# Patient Record
Sex: Female | Born: 1975 | Race: White | Hispanic: No | Smoking: Never smoker
Health system: Southern US, Community
[De-identification: ages and names within clinical notes are randomized; demographics above are authoritative.]

## PROBLEM LIST (undated history)

## (undated) DIAGNOSIS — F419 Anxiety disorder, unspecified: Secondary | ICD-10-CM

## (undated) DIAGNOSIS — F329 Major depressive disorder, single episode, unspecified: Secondary | ICD-10-CM

## (undated) DIAGNOSIS — D649 Anemia, unspecified: Secondary | ICD-10-CM

## (undated) HISTORY — DX: Anemia, unspecified: D64.9

## (undated) HISTORY — DX: Anxiety disorder, unspecified: F41.9

## (undated) HISTORY — DX: Major depressive disorder, single episode, unspecified: F32.9

## (undated) HISTORY — PX: GASTRIC BYPASS: SHX52

---

## 2003-07-21 DIAGNOSIS — F341 Dysthymic disorder: Secondary | ICD-10-CM | POA: Insufficient documentation

## 2003-11-20 HISTORY — PX: CHOLECYSTECTOMY: SHX55

## 2003-11-20 HISTORY — PX: APPENDECTOMY: SHX54

## 2004-01-25 ENCOUNTER — Other Ambulatory Visit: Payer: Self-pay

## 2004-09-27 ENCOUNTER — Inpatient Hospital Stay: Payer: Self-pay | Admitting: General Surgery

## 2004-11-23 DIAGNOSIS — A6000 Herpesviral infection of urogenital system, unspecified: Secondary | ICD-10-CM | POA: Insufficient documentation

## 2004-12-06 LAB — HM HEPATITIS C SCREENING LAB: HM Hepatitis Screen: NEGATIVE

## 2005-02-18 ENCOUNTER — Emergency Department: Payer: Self-pay | Admitting: Emergency Medicine

## 2014-07-20 LAB — HM PAP SMEAR: HM PAP: POSITIVE

## 2014-10-05 LAB — CBC AND DIFFERENTIAL
HCT: 29 % — AB (ref 36–46)
Hemoglobin: 8.2 g/dL — AB (ref 12.0–16.0)
PLATELETS: 376 10*3/uL (ref 150–399)
WBC: 3.8 10*3/mL

## 2014-10-05 LAB — TSH: TSH: 3.96 u[IU]/mL (ref 0.41–5.90)

## 2015-07-13 HISTORY — PX: KNEE ARTHROSCOPY W/ ACL RECONSTRUCTION: SHX1858

## 2015-08-31 LAB — CBC AND DIFFERENTIAL
HCT: 36 % (ref 36–46)
HEMOGLOBIN: 12.2 g/dL (ref 12.0–16.0)
Neutrophils Absolute: 58 /uL
Platelets: 272 10*3/uL (ref 150–399)
WBC: 4.2 10*3/mL

## 2015-08-31 LAB — HEPATIC FUNCTION PANEL
ALK PHOS: 82 U/L (ref 25–125)
ALT: 14 U/L (ref 7–35)
AST: 18 U/L (ref 13–35)
BILIRUBIN, TOTAL: 0.5 mg/dL

## 2015-08-31 LAB — BASIC METABOLIC PANEL
BUN: 9 mg/dL (ref 4–21)
CREATININE: 1 mg/dL (ref 0.5–1.1)
Glucose: 86 mg/dL
POTASSIUM: 4.5 mmol/L (ref 3.4–5.3)
Sodium: 139 mmol/L (ref 137–147)

## 2015-08-31 LAB — LIPID PANEL
Cholesterol: 138 mg/dL (ref 0–200)
HDL: 69 mg/dL (ref 35–70)
LDL Cholesterol: 57 mg/dL
LDL/HDL RATIO: 0.8
TRIGLYCERIDES: 58 mg/dL (ref 40–160)

## 2015-08-31 LAB — TSH: TSH: 4 u[IU]/mL (ref 0.41–5.90)

## 2015-10-26 ENCOUNTER — Other Ambulatory Visit: Payer: Self-pay | Admitting: Family Medicine

## 2015-10-26 DIAGNOSIS — D509 Iron deficiency anemia, unspecified: Secondary | ICD-10-CM | POA: Insufficient documentation

## 2015-10-31 ENCOUNTER — Encounter: Payer: Self-pay | Admitting: Family Medicine

## 2015-10-31 ENCOUNTER — Ambulatory Visit (INDEPENDENT_AMBULATORY_CARE_PROVIDER_SITE_OTHER): Payer: BLUE CROSS/BLUE SHIELD | Admitting: Family Medicine

## 2015-10-31 VITALS — BP 122/86 | HR 85 | Temp 98.1°F | Resp 16 | Ht 63.0 in | Wt 226.0 lb

## 2015-10-31 DIAGNOSIS — B977 Papillomavirus as the cause of diseases classified elsewhere: Secondary | ICD-10-CM | POA: Diagnosis not present

## 2015-10-31 DIAGNOSIS — R7989 Other specified abnormal findings of blood chemistry: Secondary | ICD-10-CM | POA: Insufficient documentation

## 2015-10-31 DIAGNOSIS — IMO0002 Reserved for concepts with insufficient information to code with codable children: Secondary | ICD-10-CM | POA: Insufficient documentation

## 2015-10-31 DIAGNOSIS — F325 Major depressive disorder, single episode, in full remission: Secondary | ICD-10-CM

## 2015-10-31 DIAGNOSIS — F411 Generalized anxiety disorder: Secondary | ICD-10-CM | POA: Insufficient documentation

## 2015-10-31 DIAGNOSIS — R8789 Other abnormal findings in specimens from female genital organs: Secondary | ICD-10-CM | POA: Diagnosis not present

## 2015-10-31 DIAGNOSIS — G4700 Insomnia, unspecified: Secondary | ICD-10-CM | POA: Diagnosis not present

## 2015-10-31 DIAGNOSIS — Z3009 Encounter for other general counseling and advice on contraception: Secondary | ICD-10-CM | POA: Insufficient documentation

## 2015-10-31 DIAGNOSIS — E669 Obesity, unspecified: Secondary | ICD-10-CM | POA: Insufficient documentation

## 2015-10-31 DIAGNOSIS — E559 Vitamin D deficiency, unspecified: Secondary | ICD-10-CM | POA: Insufficient documentation

## 2015-10-31 MED ORDER — TRAZODONE HCL 50 MG PO TABS: 50.0000 mg | ORAL_TABLET | Freq: Every day | ORAL | Status: DC

## 2015-10-31 MED ORDER — ALPRAZOLAM 0.5 MG PO TABS: 0.5000 mg | ORAL_TABLET | Freq: Two times a day (BID) | ORAL | Status: DC | PRN

## 2015-10-31 NOTE — Progress Notes (Signed)
Patient ID: Carrie Stokes, female   DOB: [birth year], 39 y.o.   MRN: [id number]        Patient: Carrie Stokes Female    DOB: 1976/07/15   39 y.o.   MRN: [id number] Visit Date: 10/31/2015  Today's Provider: Margarita Rana, MD   Chief Complaint  Patient presents with  . Anemia  . Abnormal Pap Smear  . Anxiety   Subjective:    HPI Follow-up Anemia: Patient presents for follow-up of anemia. Anemia was found by after [place] By-pass.  It has been present for 1 year.  Associated signs & symptoms: none. Patient had labs done on 08/31/15 at [facility].  No abnormalities noted.  Reviewed at [facility] today.   Lab Results  Component Value Date   WBC 4.2 08/31/2015   HGB 12.2 08/31/2015   HCT 36 08/31/2015   PLT 272 08/31/2015   CHOL 138 08/31/2015   TRIG 58 08/31/2015   HDL 69 08/31/2015   LDLCALC 57 08/31/2015   ALT 14 08/31/2015   AST 18 08/31/2015   NA 139 08/31/2015   K 4.5 08/31/2015   CREATININE 1.0 08/31/2015   BUN 9 08/31/2015   TSH 4.00 08/31/2015     Anxiety: Patient complains of anxiety disorder and sleep disturbance.  She has the following symptoms: dizziness, fatigue, insomnia, irritable, palpitations. Onset of symptoms was approximately 1 year ago, gradually worsening since that time. She denies current suicidal and homicidal ideation. Family history significant for no psychiatric illness.Possible organic causes contributing are: family problems (husband). Risk factors: none Previous treatment includes Xanax and medication.  She complains of the following side effects from the treatment: none. Patient reports that she has been off alprazolam since April of this year. Patient is requesting refill. Is also off her Zoloft and Trazodone.  Husband still drinking.  Can not get him to quit. Has even talked to a Chief Executive Officer. She also has not been able to work out because of knee pain. Needed surgery.  Had the surgery. Just got released.  Planning to go back to the gym.      Follow up for abnormal pap  The patient was last seen for this 1 years ago on 07/21/2015. Pap was normal, HPV positive. Patient advised to recheck in 1 year.   --------------------------------------------------------------------------------------------------------------------------       Allergies  Allergen Reactions  . Cetirizine Hives  . Pseudoephedrine Hives  . Terazol  [Terconazole] Rash and Swelling   Previous Medications   FERROUS GLUCONATE (FERGON) 240 (27 FE) MG TABLET    TAKE 1 TABLET BY MOUTH 2 TIMES A DAY   LEVONORGESTREL (MIRENA, 52 MG,) 20 MCG/24HR IUD    by Intrauterine route.   MULTIPLE VITAMIN (MULTIVITAMINS PO)    Take by mouth.    Review of Systems  Social History  Substance Use Topics  . Smoking status: Never Smoker   . Smokeless tobacco: Never Used  . Alcohol Use: Yes     Comment: occasional   Objective:   BP 122/86 mmHg  Pulse 85  Temp(Src) 98.1 F (36.7 C) (Oral)  Resp 16  Ht 5\' 3"  (1.6 m)  Wt 226 lb (102.513 kg)  BMI 40.04 kg/m2  SpO2 98%  Physical Exam  Constitutional: She is oriented to person, place, and time. She appears well-developed and well-nourished.  Cardiovascular: Normal rate and regular rhythm.   Pulmonary/Chest: Effort normal and breath sounds normal.  Genitourinary: Vagina normal.  Pap done today.   Neurological: She is alert and oriented to  person, place, and time.  Psychiatric: She has a normal mood and affect. Her behavior is normal. Judgment and thought content normal.      Assessment & Plan:     1. Abnormal cervical Pap smear with positive HPV DNA test Done today.  - Pap IG and HPV (high risk) DNA detection  2. Anxiety, generalized Will restart medication to use as needed.  - ALPRAZolam (XANAX) 0.5 MG tablet; Take 1 tablet (0.5 mg total) by mouth 2 (two) times daily as needed for anxiety.  Dispense: 60 tablet; Refill: 0  3. Depression, major, in remission ([place]) Does not currently feel depressed, more  anxious. Will restart Trazodone and Xanax, consider restarting SSRI if this does not help.  4. Cannot sleep Condition is worsening. Will start medication for better control.  Patient instructed to call back if condition worsens or does not improve.    - traZODone (DESYREL) 50 MG tablet; Take 1 tablet (50 mg total) by mouth at bedtime.  Dispense: 30 tablet; Refill: 5      Patient was seen and examined by Jerrell Belfast, MD, and note scribed by Lynford Humphrey, [place].   I have reviewed the document for accuracy and completeness and I agree with above. - Jerrell Belfast, MD   Margarita Rana, MD  [facility]

## 2015-11-03 LAB — PAP IG AND HPV HIGH-RISK
HPV, HIGH-RISK: POSITIVE — AB
PAP Smear Comment: 0

## 2015-11-07 ENCOUNTER — Telehealth: Payer: Self-pay

## 2015-11-07 DIAGNOSIS — R87629 Unspecified abnormal cytological findings in specimens from vagina: Secondary | ICD-10-CM

## 2015-11-07 NOTE — Telephone Encounter (Signed)
Pt advised. She would like to be referred to [facility].   Thanks,   -Mickel Baas

## 2015-11-07 NOTE — Telephone Encounter (Signed)
LMTCB 11/07/2015  Thanks,   -Juanice Warburton 

## 2015-11-07 NOTE — Telephone Encounter (Signed)
-----   Message from Margarita Rana, MD sent at 11/03/2015  5:09 PM EST ----- HPV still positive with some atypical cells.  No cancer cells. Referral to gyn to evaluate and treat. Thanks.

## 2015-11-18 ENCOUNTER — Ambulatory Visit (INDEPENDENT_AMBULATORY_CARE_PROVIDER_SITE_OTHER): Payer: BLUE CROSS/BLUE SHIELD | Admitting: Family Medicine

## 2015-11-18 ENCOUNTER — Encounter: Payer: Self-pay | Admitting: Family Medicine

## 2015-11-18 VITALS — BP 128/70 | HR 98 | Temp 98.2°F | Resp 16 | Ht 63.0 in | Wt 230.0 lb

## 2015-11-18 DIAGNOSIS — L509 Urticaria, unspecified: Secondary | ICD-10-CM | POA: Diagnosis not present

## 2015-11-18 MED ORDER — PREDNISONE 10 MG PO TABS: ORAL_TABLET | ORAL | Status: DC

## 2015-11-18 NOTE — Progress Notes (Signed)
Patient ID: Carrie Stokes, female   DOB: 04-09-1976, 39 y.o.   MRN: [id number]       Patient: Carrie Stokes Female    DOB: 07-18-1976   39 y.o.   MRN: [id number] Visit Date: 11/18/2015  Today's Provider: Margarita Rana, MD   Chief Complaint  Patient presents with  . Urticaria    worsened since yesterday.    Subjective:    Urticaria This is a new problem. The current episode started yesterday. The problem has been gradually worsening since onset. The affected locations include the face, head, scalp, right ear, left ear, neck and chest. The rash is characterized by redness, swelling, burning and itchiness. She was exposed to nothing. Associated symptoms include facial edema. Pertinent negatives include no congestion, cough, eye pain, fever, joint pain, shortness of breath or sore throat. (Patient reports that she feels like her tongue is starting to swell. ) Past treatments include antihistamine. The treatment provided moderate relief.       Allergies  Allergen Reactions  . Cetirizine Hives  . Pseudoephedrine Hives  . Terazol  [Terconazole] Rash and Swelling   Previous Medications   ALPRAZOLAM (XANAX) 0.5 MG TABLET    Take 1 tablet (0.5 mg total) by mouth 2 (two) times daily as needed for anxiety.   FERROUS GLUCONATE (FERGON) 240 (27 FE) MG TABLET    TAKE 1 TABLET BY MOUTH 2 TIMES A DAY   LEVONORGESTREL (MIRENA, 52 MG,) 20 MCG/24HR IUD    by Intrauterine route.   MULTIPLE VITAMIN (MULTIVITAMINS PO)    Take by mouth.   TRAZODONE (DESYREL) 50 MG TABLET    Take 1 tablet (50 mg total) by mouth at bedtime.    Review of Systems  Constitutional: Negative.  Negative for fever.  HENT: Negative.  Negative for congestion and sore throat.   Eyes: Negative for pain.  Respiratory: Negative.  Negative for cough and shortness of breath.   Cardiovascular: Negative.   Musculoskeletal: Negative for joint pain.  Skin: Positive for color change and rash. Negative for pallor and  wound.  Psychiatric/Behavioral: The patient is nervous/anxious.     Social History  Substance Use Topics  . Smoking status: Never Smoker   . Smokeless tobacco: Never Used  . Alcohol Use: Yes     Comment: occasional   Objective:   BP 128/70 mmHg  Pulse 98  Temp(Src) 98.2 F (36.8 C)  Resp 16  Ht 5\' 3"  (1.6 m)  Wt 230 lb (104.327 kg)  BMI 40.75 kg/m2  SpO2 99%  Physical Exam  Constitutional: She is oriented to person, place, and time. She appears well-developed and well-nourished.  Cardiovascular: Normal rate and regular rhythm.   Pulmonary/Chest: Effort normal and breath sounds normal.  Musculoskeletal: She exhibits no edema.  Neurological: She is alert and oriented to person, place, and time.  Skin: Rash noted. There is erythema.  Erythema behind ears, forehead, and both eyelids.   Psychiatric: She has a normal mood and affect. Her behavior is normal. Judgment and thought content normal.  Nursing note and vitals reviewed.     Assessment & Plan:     1. Urticarial dermatitis New problem. Worsening. Suspect related to stress.  Will treat.  Patient instructed to call back if condition worsens or does not improve and may need treatment for her anxiety.  - predniSONE (DELTASONE) 10 MG tablet; 6 po for 2 days and then 5 po for 2 days and then 4 po for 2 days and 3 po  for 2 days and then 2 po for 2 days and then 1 po for 2 days.  Dispense: 42 tablet; Refill: 0      Margarita Rana, MD  [facility]

## 2016-01-03 ENCOUNTER — Other Ambulatory Visit: Payer: Self-pay

## 2016-01-03 DIAGNOSIS — G4700 Insomnia, unspecified: Secondary | ICD-10-CM

## 2016-01-03 MED ORDER — TRAZODONE HCL 50 MG PO TABS: 50.0000 mg | ORAL_TABLET | Freq: Every day | ORAL | Status: DC

## 2016-01-03 NOTE — Addendum Note (Signed)
Addended by: Ashley Royalty E on: 01/03/2016 11:30 AM   Modules accepted: Orders

## 2016-02-16 ENCOUNTER — Encounter: Payer: Self-pay | Admitting: Family Medicine

## 2016-02-27 DIAGNOSIS — M531 Cervicobrachial syndrome: Secondary | ICD-10-CM | POA: Diagnosis not present

## 2016-02-27 DIAGNOSIS — M9902 Segmental and somatic dysfunction of thoracic region: Secondary | ICD-10-CM | POA: Diagnosis not present

## 2016-02-27 DIAGNOSIS — M5387 Other specified dorsopathies, lumbosacral region: Secondary | ICD-10-CM | POA: Diagnosis not present

## 2016-05-28 DIAGNOSIS — Z124 Encounter for screening for malignant neoplasm of cervix: Secondary | ICD-10-CM | POA: Diagnosis not present

## 2016-05-28 DIAGNOSIS — Z1151 Encounter for screening for human papillomavirus (HPV): Secondary | ICD-10-CM | POA: Diagnosis not present

## 2016-05-28 DIAGNOSIS — R87618 Other abnormal cytological findings on specimens from cervix uteri: Secondary | ICD-10-CM | POA: Diagnosis not present

## 2016-05-28 DIAGNOSIS — Z760 Encounter for issue of repeat prescription: Secondary | ICD-10-CM | POA: Diagnosis not present

## 2016-07-15 DIAGNOSIS — L03119 Cellulitis of unspecified part of limb: Secondary | ICD-10-CM | POA: Diagnosis not present

## 2016-07-17 ENCOUNTER — Ambulatory Visit (INDEPENDENT_AMBULATORY_CARE_PROVIDER_SITE_OTHER): Payer: BLUE CROSS/BLUE SHIELD | Admitting: Family Medicine

## 2016-07-17 ENCOUNTER — Encounter: Payer: Self-pay | Admitting: Family Medicine

## 2016-07-17 VITALS — BP 124/74 | HR 97 | Temp 98.5°F | Resp 16 | Wt 243.4 lb

## 2016-07-17 DIAGNOSIS — L02434 Carbuncle of left upper limb: Secondary | ICD-10-CM | POA: Diagnosis not present

## 2016-07-17 NOTE — Progress Notes (Signed)
Subjective:     Patient ID: Carrie Stokes, female   DOB: [birth year], 40 y.o.   MRN: [id number]  HPI  Chief Complaint  Patient presents with  . Cellulitis    Patient comes in office today for follow up visit after being seen at [facility] on 07/15/16. Patient states that on 07/14/16 she had returned back from a cruise to the [place], patient had noticed a small white bump on her left elbow. Patient states that she was prescribed clindamycin 300mg , she reports skin is hot and tight and she has more swelling around site and tingling in her arm.   No hx of or exposure to MRSA. Placed on clindamycin 300 mg. 4 x day for 10 days.   Review of Systems     Objective:   Physical Exam  Constitutional: She appears well-developed and well-nourished. No distress.  Skin:  Left proximal forearm with pointing carbuncle Procedure note: cleansed with alcohol and infiltrated with lidocaine/epinephrine. I & D with moderate return of pus and blood. C & S obtained       Assessment:    1. Carbuncle of arm, left - WOUND CULTURE - INCISION AND DRAINAGE; Future    Plan:    Discussed wound care with further f/u pending culture results.

## 2016-07-17 NOTE — Patient Instructions (Signed)
We will call you with the culture report. Continue clindamycin for now. Cleanse with soap and water and apply bandaid.

## 2016-07-20 ENCOUNTER — Telehealth: Payer: Self-pay

## 2016-07-20 ENCOUNTER — Other Ambulatory Visit: Payer: Self-pay | Admitting: Family Medicine

## 2016-07-20 DIAGNOSIS — N760 Acute vaginitis: Secondary | ICD-10-CM

## 2016-07-20 LAB — WOUND CULTURE

## 2016-07-20 MED ORDER — TERCONAZOLE 0.8 % VA CREA: 1.0000 | TOPICAL_CREAM | Freq: Every day | VAGINAL | 0 refills | Status: DC

## 2016-07-20 MED ORDER — FLUCONAZOLE 150 MG PO TABS: 150.0000 mg | ORAL_TABLET | Freq: Once | ORAL | 0 refills | Status: AC

## 2016-07-20 NOTE — Telephone Encounter (Signed)
Cream sent in

## 2016-07-20 NOTE — Telephone Encounter (Signed)
Spoke with patient on the phone and advised, she states that she never had a reaction to terconazole cream and that she has been using the last bit she had from an old prescription at home to give her some relief. Patient is asking that you send cream into pharmacy instead of pill. KW

## 2016-07-20 NOTE — Telephone Encounter (Signed)
-----   Message from Carmon Ginsberg, [place] sent at 07/20/2016  7:45 AM EDT ----- You have a staph infection (not MRSA) sensitive to the clindamycin you are on. Is it getting better?

## 2016-07-20 NOTE — Telephone Encounter (Signed)
Patient was advised she states that wound is still painful and hot to the touch, she reports brownish bloody like drainage from site but states that it has decreased in size. Patient reports that she is taking the Clindamycin, she complains of external vaginal itching and request that you send in vaginal cream to [facility] on [address]. Please advise. KW

## 2016-07-20 NOTE — Telephone Encounter (Signed)
I have sent in the Diflucan pill as she has had prior reaction to terconazole cream.

## 2016-08-02 ENCOUNTER — Other Ambulatory Visit: Payer: Self-pay | Admitting: Family Medicine

## 2016-08-02 DIAGNOSIS — G4700 Insomnia, unspecified: Secondary | ICD-10-CM

## 2016-12-07 ENCOUNTER — Encounter: Payer: Self-pay | Admitting: Physician Assistant

## 2016-12-07 ENCOUNTER — Ambulatory Visit (INDEPENDENT_AMBULATORY_CARE_PROVIDER_SITE_OTHER): Payer: BLUE CROSS/BLUE SHIELD | Admitting: Physician Assistant

## 2016-12-07 VITALS — BP 138/88 | HR 80 | Temp 98.3°F | Resp 16 | Ht 63.0 in | Wt 250.0 lb

## 2016-12-07 DIAGNOSIS — E6609 Other obesity due to excess calories: Secondary | ICD-10-CM | POA: Diagnosis not present

## 2016-12-07 DIAGNOSIS — IMO0001 Reserved for inherently not codable concepts without codable children: Secondary | ICD-10-CM

## 2016-12-07 DIAGNOSIS — F411 Generalized anxiety disorder: Secondary | ICD-10-CM | POA: Diagnosis not present

## 2016-12-07 DIAGNOSIS — Z6841 Body Mass Index (BMI) 40.0 and over, adult: Secondary | ICD-10-CM | POA: Diagnosis not present

## 2016-12-07 DIAGNOSIS — D234 Other benign neoplasm of skin of scalp and neck: Secondary | ICD-10-CM

## 2016-12-07 MED ORDER — PHENTERMINE HCL 37.5 MG PO TABS: 37.5000 mg | ORAL_TABLET | Freq: Every day | ORAL | 0 refills | Status: DC

## 2016-12-07 MED ORDER — ALPRAZOLAM 0.5 MG PO TABS: 0.5000 mg | ORAL_TABLET | Freq: Two times a day (BID) | ORAL | 5 refills | Status: DC | PRN

## 2016-12-07 NOTE — Progress Notes (Signed)
Patient: Carrie Stokes Female    DOB: [birth year]   41 y.o.   MRN: [id number] Visit Date: 12/07/2016  Today's Provider: Mar Daring, PA-C   Chief Complaint  Patient presents with  . Anxiety   Subjective:    HPI   Anxiety, Follow-up  She  was last seen for this 1 years ago. Changes made at last visit include no changes.   She reports excellent compliance with treatment. She is not having side effects.   She reports excellent tolerance of treatment. Current symptoms include: fatigue and weight gain She feels she is Worse since last visit.  ------------------------------------------------------------------------  Patient here today to talk about weight loss options. Patient reports she has tried phentermine 37.5 mg in the past with good results.   Patient c/o some "spots" on her head. Patient reports spots have been there since she was 41 years old. Patient reports that one of the spots is tender to the touch.    Allergies  Allergen Reactions  . Cetirizine Hives  . Pseudoephedrine Hives     Current Outpatient Prescriptions:  .  ALPRAZolam (XANAX) 0.5 MG tablet, Take 1 tablet (0.5 mg total) by mouth 2 (two) times daily as needed for anxiety., Disp: 60 tablet, Rfl: 0 .  ferrous gluconate (FERGON) 240 (27 FE) MG tablet, TAKE 1 TABLET BY MOUTH 2 TIMES A DAY, Disp: 100 tablet, Rfl: 5 .  levonorgestrel (MIRENA, 52 MG,) 20 MCG/24HR IUD, by Intrauterine route., Disp: , Rfl:  .  Multiple Vitamin (MULTIVITAMINS PO), Take by mouth., Disp: , Rfl:  .  traZODone (DESYREL) 50 MG tablet, TAKE 1 TABLET (50 MG TOTAL) BY MOUTH AT BEDTIME., Disp: 90 tablet, Rfl: 1  Review of Systems  Constitutional: Positive for fatigue and unexpected weight change.  Respiratory: Negative.   Cardiovascular: Negative.   Gastrointestinal: Negative.   Endocrine: Negative.   Skin:       Tender nodules on scalp  Neurological: Negative.   Psychiatric/Behavioral: Negative.      Social History  Substance Use Topics  . Smoking status: Never Smoker  . Smokeless tobacco: Never Used  . Alcohol use Yes     Comment: occasional   Objective:   BP 138/88 (BP Location: Left Arm, Patient Position: Sitting, Cuff Size: Large)   Pulse 80   Temp 98.3 F (36.8 C) (Oral)   Resp 16   Ht 5\' 3"  (1.6 m)   Wt 250 lb (113.4 kg)   BMI 44.29 kg/m   Physical Exam  Constitutional: She appears well-developed and well-nourished. No distress.  Neck: Normal range of motion. Neck supple. No tracheal deviation present. No thyromegaly present.  Cardiovascular: Normal rate, regular rhythm and normal heart sounds.  Exam reveals no gallop and no friction rub.   No murmur heard. Pulmonary/Chest: Effort normal and breath sounds normal. No respiratory distress. She has no wheezes. She has no rales.  Lymphadenopathy:    She has no cervical adenopathy.  Skin: Rash noted. Rash is nodular. She is not diaphoretic.     Psychiatric: She has a normal mood and affect. Her behavior is normal. Judgment and thought content normal.  Vitals reviewed.     Assessment & Plan:     1. Anxiety, generalized Stable. Diagnosis pulled for medication refill. Continue current medical treatment plan. - ALPRAZolam (XANAX) 0.5 MG tablet; Take 1 tablet (0.5 mg total) by mouth 2 (two) times daily as needed for anxiety.  Dispense: 60 tablet; Refill: 5  2.  Dermoid cyst of scalp Patient already has a dermatologist in [place]. She will call to schedule.  3. Class 3 obesity due to excess calories with serious comorbidity and body mass index (BMI) of 40.0 to 44.9 in adult [facility]) Patient has been exercising and using a food diary but not limiting completely. Advised patient to be more strict with her food diary and make exercise more scheduled. Will add phentermine as below for appetite suppression. I will see her back in 4 weeks for weight recheck. - phentermine (ADIPEX-P) 37.5 MG tablet; Take 1 tablet (37.5 mg total)  by mouth daily before breakfast.  Dispense: 30 tablet; Refill: 0       Mar Daring, PA-C  [facility]

## 2016-12-07 NOTE — Patient Instructions (Signed)
Epidermal Cyst An epidermal cyst is sometimes called a sebaceous cyst, epidermal inclusion cyst, or infundibular cyst. These cysts usually contain a substance that looks "pasty" or "cheesy" and may have a bad smell. This substance is a protein called keratin. Epidermal cysts are usually found on the face, neck, or trunk. They may also occur in the vaginal area or other parts of the genitalia of both men and women. Epidermal cysts are usually small, painless, slow-growing bumps or lumps that move freely under the skin. It is important not to try to pop them. This may cause an infection and lead to tenderness and swelling. CAUSES  Epidermal cysts may be caused by a deep penetrating injury to the skin or a plugged hair follicle, often associated with acne. SYMPTOMS  Epidermal cysts can become inflamed and cause:  Redness.  Tenderness.  Increased temperature of the skin over the bumps or lumps.  Grayish-white, bad smelling material that drains from the bump or lump. DIAGNOSIS  Epidermal cysts are easily diagnosed by your caregiver during an exam. Rarely, a tissue sample (biopsy) may be taken to rule out other conditions that may resemble epidermal cysts. TREATMENT   Epidermal cysts often get better and disappear on their own. They are rarely ever cancerous.  If a cyst becomes infected, it may become inflamed and tender. This may require opening and draining the cyst. Treatment with antibiotics may be necessary. When the infection is gone, the cyst may be removed with minor surgery.  Small, inflamed cysts can often be treated with antibiotics or by injecting steroid medicines.  Sometimes, epidermal cysts become large and bothersome. If this happens, surgical removal in your caregiver's office may be necessary. HOME CARE INSTRUCTIONS  Only take over-the-counter or prescription medicines as directed by your caregiver.  Take your antibiotics as directed. Finish them even if you start to feel  better. SEEK MEDICAL CARE IF:   Your cyst becomes tender, red, or swollen.  Your condition is not improving or is getting worse.  You have any other questions or concerns. MAKE SURE YOU:  Understand these instructions.  Will watch your condition.  Will get help right away if you are not doing well or get worse. This information is not intended to replace advice given to you by your health care provider. Make sure you discuss any questions you have with your health care provider. Document Released: 10/06/2004 Document Revised: 01/28/2012 Document Reviewed: 09/07/2015 Elsevier Interactive Patient Education  2017 [employer].

## 2016-12-10 DIAGNOSIS — L7211 Pilar cyst: Secondary | ICD-10-CM | POA: Diagnosis not present

## 2016-12-19 DIAGNOSIS — L7211 Pilar cyst: Secondary | ICD-10-CM | POA: Diagnosis not present

## 2017-01-03 DIAGNOSIS — L720 Epidermal cyst: Secondary | ICD-10-CM | POA: Diagnosis not present

## 2017-01-03 DIAGNOSIS — L7211 Pilar cyst: Secondary | ICD-10-CM | POA: Diagnosis not present

## 2017-01-04 ENCOUNTER — Ambulatory Visit: Payer: BLUE CROSS/BLUE SHIELD | Admitting: Physician Assistant

## 2017-01-09 ENCOUNTER — Ambulatory Visit (INDEPENDENT_AMBULATORY_CARE_PROVIDER_SITE_OTHER): Payer: BLUE CROSS/BLUE SHIELD | Admitting: Physician Assistant

## 2017-01-09 ENCOUNTER — Encounter: Payer: Self-pay | Admitting: Physician Assistant

## 2017-01-09 DIAGNOSIS — Z6841 Body Mass Index (BMI) 40.0 and over, adult: Secondary | ICD-10-CM

## 2017-01-09 DIAGNOSIS — E6609 Other obesity due to excess calories: Secondary | ICD-10-CM | POA: Diagnosis not present

## 2017-01-09 DIAGNOSIS — IMO0001 Reserved for inherently not codable concepts without codable children: Secondary | ICD-10-CM

## 2017-01-09 DIAGNOSIS — F5101 Primary insomnia: Secondary | ICD-10-CM | POA: Diagnosis not present

## 2017-01-09 MED ORDER — TRAZODONE HCL 50 MG PO TABS: 50.0000 mg | ORAL_TABLET | Freq: Every day | ORAL | 1 refills | Status: DC

## 2017-01-09 MED ORDER — PHENTERMINE HCL 37.5 MG PO TABS: 37.5000 mg | ORAL_TABLET | Freq: Every day | ORAL | 3 refills | Status: DC

## 2017-01-09 NOTE — Progress Notes (Signed)
Patient: Carrie Stokes Female    DOB: [birth year]   41 y.o.   MRN: [id number] Visit Date: 01/09/2017  Today's Provider: Mar Daring, PA-C   Chief Complaint  Patient presents with  . Follow-up    Weight Loss Counseling   Subjective:    HPI Patient is here today to follow up on weight loss counseling. She reports she is counting calories and keeping her food diary. She is using My [employer]itness Pal. She reports she started doing 1/2 of the phentermine in the morning and the other 1/2 of the tablet around 3 pm.She reports she got out of track. She had 3 cyst removed and her half-brother passed away January 13, 2017 from a heart attack. He was 41 years old. She does report he was a severe alcoholic.      Allergies  Allergen Reactions  . Cetirizine Hives  . Pseudoephedrine Hives     Current Outpatient Prescriptions:  .  ALPRAZolam (XANAX) 0.5 MG tablet, Take 1 tablet (0.5 mg total) by mouth 2 (two) times daily as needed for anxiety., Disp: 60 tablet, Rfl: 5 .  ferrous gluconate (FERGON) 240 (27 FE) MG tablet, TAKE 1 TABLET BY MOUTH 2 TIMES A DAY, Disp: 100 tablet, Rfl: 5 .  levonorgestrel (MIRENA, 52 MG,) 20 MCG/24HR IUD, by Intrauterine route., Disp: , Rfl:  .  Multiple Vitamin (MULTIVITAMINS PO), Take by mouth., Disp: , Rfl:  .  phentermine (ADIPEX-P) 37.5 MG tablet, Take 1 tablet (37.5 mg total) by mouth daily before breakfast., Disp: 30 tablet, Rfl: 0 .  traZODone (DESYREL) 50 MG tablet, TAKE 1 TABLET (50 MG TOTAL) BY MOUTH AT BEDTIME., Disp: 90 tablet, Rfl: 1  Review of Systems  Constitutional: Negative.   Respiratory: Negative.   Cardiovascular: Negative.   Gastrointestinal: Negative.   Genitourinary: Negative.   Neurological: Negative.     Social History  Substance Use Topics  . Smoking status: Never Smoker  . Smokeless tobacco: Never Used  . Alcohol use Yes     Comment: occasional   Objective:   BP 122/88 (BP Location: Right Arm, Patient Position:  Sitting, Cuff Size: Large)   Pulse 100   Temp 98.5 F (36.9 C) (Oral)   Resp 16   Wt 237 lb 9.6 oz (107.8 kg)   BMI 42.09 kg/m   Physical Exam  Constitutional: She appears well-developed and well-nourished. No distress.  HENT:  Has well healing scar on left parietal region, new incision with 4 staples near midline at near coronal suture area  Neck: Normal range of motion. Neck supple. No tracheal deviation present. No thyromegaly present.  Cardiovascular: Normal rate, regular rhythm and normal heart sounds.  Exam reveals no gallop and no friction rub.   No murmur heard. Pulmonary/Chest: Effort normal and breath sounds normal. No respiratory distress. She has no wheezes. She has no rales.  Lymphadenopathy:    She has no cervical adenopathy.  Skin: She is not diaphoretic.  Psychiatric: She has a normal mood and affect. Her behavior is normal. Judgment and thought content normal.  Vitals reviewed.      Assessment & Plan:     1. Class 3 obesity due to excess calories with serious comorbidity and body mass index (BMI) of 40.0 to 44.9 in adult [facility]) She is doing well and has lost 13 pounds since January. Will continue phentermine as below. She can continue to cut the medication in half as she has been doing. I will see  her back in 3 months for recheck of weight. - phentermine (ADIPEX-P) 37.5 MG tablet; Take 1 tablet (37.5 mg total) by mouth daily before breakfast.  Dispense: 30 tablet; Refill: 3  2. Primary insomnia Stable. Diagnosis pulled for medication refill. Continue current medical treatment plan. - traZODone (DESYREL) 50 MG tablet; Take 1 tablet (50 mg total) by mouth at bedtime.  Dispense: 90 tablet; Refill: [place], PA-C  [facility]

## 2017-01-09 NOTE — Patient Instructions (Signed)
Exercising to Lose Weight Introduction Exercising can help you to lose weight. In order to lose weight through exercise, you need to do vigorous-intensity exercise. You can tell that you are exercising with vigorous intensity if you are breathing very hard and fast and cannot hold a conversation while exercising. Moderate-intensity exercise helps to maintain your current weight. You can tell that you are exercising at a moderate level if you have a higher heart rate and faster breathing, but you are still able to hold a conversation. How often should I exercise? Choose an activity that you enjoy and set realistic goals. Your health care provider can help you to make an activity plan that works for you. Exercise regularly as directed by your health care provider. This may include:  Doing resistance training twice each week, such as:  Push-ups.  Sit-ups.  Lifting weights.  Using resistance bands.  Doing a given intensity of exercise for a given amount of time. Choose from these options:  150 minutes of moderate-intensity exercise every week.  75 minutes of vigorous-intensity exercise every week.  A mix of moderate-intensity and vigorous-intensity exercise every week. Children, pregnant women, people who are out of shape, people who are overweight, and older adults may need to consult a health care provider for individual recommendations. If you have any sort of medical condition, be sure to consult your health care provider before starting a new exercise program. What are some activities that can help me to lose weight?  Walking at a rate of at least 4.5 miles an hour.  Jogging or running at a rate of 5 miles per hour.  Biking at a rate of at least 10 miles per hour.  Lap swimming.  Roller-skating or in-line skating.  Cross-country skiing.  Vigorous competitive sports, such as football, basketball, and soccer.  Jumping rope.  Aerobic dancing. How can I be more active in my  day-to-day activities?  Use the stairs instead of the elevator.  Take a walk during your lunch break.  If you drive, park your car farther away from work or school.  If you take public transportation, get off one stop early and walk the rest of the way.  Make all of your phone calls while standing up and walking around.  Get up, stretch, and walk around every 30 minutes throughout the day. What guidelines should I follow while exercising?  Do not exercise so much that you hurt yourself, feel dizzy, or get very short of breath.  Consult your health care provider prior to starting a new exercise program.  Wear comfortable clothes and shoes with good support.  Drink plenty of water while you exercise to prevent dehydration or heat stroke. Body water is lost during exercise and must be replaced.  Work out until you breathe faster and your heart beats faster. This information is not intended to replace advice given to you by your health care provider. Make sure you discuss any questions you have with your health care provider. Document Released: 12/08/2010 Document Revised: 04/12/2016 Document Reviewed: 04/08/2014  2017 Elsevier  

## 2017-01-22 ENCOUNTER — Encounter: Payer: Self-pay | Admitting: Physician Assistant

## 2017-04-08 ENCOUNTER — Encounter (INDEPENDENT_AMBULATORY_CARE_PROVIDER_SITE_OTHER): Payer: Self-pay | Admitting: Vascular Surgery

## 2017-04-08 ENCOUNTER — Encounter (INDEPENDENT_AMBULATORY_CARE_PROVIDER_SITE_OTHER): Payer: Self-pay

## 2017-04-08 ENCOUNTER — Ambulatory Visit (INDEPENDENT_AMBULATORY_CARE_PROVIDER_SITE_OTHER): Payer: BLUE CROSS/BLUE SHIELD | Admitting: Vascular Surgery

## 2017-04-08 DIAGNOSIS — I890 Lymphedema, not elsewhere classified: Secondary | ICD-10-CM | POA: Diagnosis not present

## 2017-04-08 DIAGNOSIS — I872 Venous insufficiency (chronic) (peripheral): Secondary | ICD-10-CM | POA: Insufficient documentation

## 2017-04-08 NOTE — Progress Notes (Signed)
MRN : [id number]  Carrie Stokes is a 41 y.o. (11/24/1975) female who presents with chief complaint of No chief complaint on file. Marland Kitchen  History of Present Illness: Patient is seen for evaluation of leg swelling. The patient first noticed the swelling remotely but is now concerned because of a significant increase in the overall edema. The swelling is associated with pain and discoloration. The patient notes that in the morning the legs are significantly improved but they steadily worsened throughout the course of the day. Elevation makes the legs better, dependency makes them much worse.   There is no history of ulcerations associated with the swelling.   The patient denies any recent changes in their medications.  The patient has not been wearing graduated compression.  The patient has no had any past angiography, interventions or vascular surgery.  The patient denies a history of DVT or PE. There is no prior history of phlebitis. There is no history of primary lymphedema.  There is no history of radiation treatment to the groin or pelvis No history of malignancies. No history of trauma or groin or pelvic surgery. No history of foreign travel or parasitic infections area    No outpatient prescriptions have been marked as taking for the 04/08/17 encounter (Appointment) with Delana Meyer, Dolores Lory, MD.    No past medical history on file.  Past Surgical History:  Procedure Laterality Date  . APPENDECTOMY  2005  . CHOLECYSTECTOMY  2005   Dr. Jamal Collin  . GASTRIC BYPASS    . KNEE ARTHROSCOPY W/ ACL RECONSTRUCTION Right 07/13/2015   Dr. Para March (miniscus)    Social History Social History  Substance Use Topics  . Smoking status: Never Smoker  . Smokeless tobacco: Never Used  . Alcohol use Yes     Comment: occasional    Family History Family History  Problem Relation Age of Onset  . Asthma Mother   . Lung cancer Maternal Grandmother   No family history of  bleeding/clotting disorders, porphyria or autoimmune disease   Allergies  Allergen Reactions  . Cetirizine Hives  . Pseudoephedrine Hives     REVIEW OF SYSTEMS (Negative unless checked)  Constitutional: [] Weight loss  [] Fever  [] Chills Cardiac: [] Chest pain   [] Chest pressure   [] Palpitations   [] Shortness of breath when laying flat   [] Shortness of breath with exertion. Vascular:  [] Pain in legs with walking   [x] Pain in legs at rest  [] History of DVT   [] Phlebitis   [x] Swelling in legs   [] Varicose veins   [] Non-healing ulcers Pulmonary:   [] Uses home oxygen   [] Productive cough   [] Hemoptysis   [] Wheeze  [] COPD   [] Asthma Neurologic:  [] Dizziness   [] Seizures   [] History of stroke   [] History of TIA  [] Aphasia   [] Vissual changes   [] Weakness or numbness in arm   [] Weakness or numbness in leg Musculoskeletal:   [] Joint swelling   [] Joint pain   [] Low back pain Hematologic:  [] Easy bruising  [] Easy bleeding   [] Hypercoagulable state   [] Anemic Gastrointestinal:  [] Diarrhea   [] Vomiting  [] Gastroesophageal reflux/heartburn   [] Difficulty swallowing. Genitourinary:  [] Chronic kidney disease   [] Difficult urination  [] Frequent urination   [] Blood in urine Skin:  [] Rashes   [] Ulcers  Psychological:  [] History of anxiety   []  History of major depression.  Physical Examination  There were no vitals filed for this visit. There is no height or weight on file to calculate BMI. Gen: WD/WN, NAD Head: [place]/AT,  No temporalis wasting.  Ear/Nose/Throat: Hearing grossly intact, nares w/o erythema or drainage, poor dentition Eyes: PER, EOMI, sclera nonicteric.  Neck: Supple, no masses.  No bruit or JVD.  Pulmonary:  Good air movement, clear to auscultation bilaterally, no use of accessory muscles.  Cardiac: RRR, normal S1, S2, no Murmurs. Vascular: soft pitting edema of the left foot and ankle no venous skin changes noted Vessel Right Left  Radial Palpable Palpable  PT Palpable Palpable  DP  Palpable Palpable  Gastrointestinal: soft, non-distended. No guarding/no peritoneal signs.  Musculoskeletal: M/S 5/5 throughout.  No deformity or atrophy.  Neurologic: CN 2-12 intact. Pain and light touch intact in extremities.  Symmetrical.  Speech is fluent. Motor exam as listed above. Psychiatric: Judgment intact, Mood & affect appropriate for pt's clinical situation. Dermatologic: No rashes or ulcers noted.  No changes consistent with cellulitis. Lymph : No Cervical lymphadenopathy, no lichenification or skin changes of chronic lymphedema.  CBC Lab Results  Component Value Date   WBC 4.2 08/31/2015   HGB 12.2 08/31/2015   HCT 36 08/31/2015   PLT 272 08/31/2015    BMET    Component Value Date/Time   NA 139 08/31/2015   K 4.5 08/31/2015   BUN 9 08/31/2015   CREATININE 1.0 08/31/2015   CrCl cannot be calculated (Patient's most recent lab result is older than the maximum 21 days allowed.).  COAG No results found for: INR, PROTIME  Radiology No results found.  Assessment/Plan 1. Lymphedema I have had a long discussion with the patient regarding swelling and why it  causes symptoms.  Patient will begin wearing graduated compression stockings class 1 (20-30 mmHg) on a daily basis a prescription was given. The patient will  beginning wearing the stockings first thing in the morning and removing them in the evening. The patient is instructed specifically not to sleep in the stockings.   In addition, behavioral modification will be initiated.  This will include frequent elevation, use of over the counter pain medications and exercise such as walking.  I have reviewed systemic causes for chronic edema such as liver, kidney and cardiac etiologies.  The patient denies problems with these organ systems.    Consideration for a lymph pump will also be made based upon the effectiveness of conservative therapy.  This would help to improve the edema control and prevent sequela such as  ulcers and infections   Patient should undergo duplex ultrasound of the venous system to ensure that DVT or reflux is not present.  The patient will follow-up with me after the ultrasound.        Hortencia Pilar, MD  04/08/2017 8:39 AM

## 2017-04-10 DIAGNOSIS — M9901 Segmental and somatic dysfunction of cervical region: Secondary | ICD-10-CM | POA: Diagnosis not present

## 2017-04-10 DIAGNOSIS — M5386 Other specified dorsopathies, lumbar region: Secondary | ICD-10-CM | POA: Diagnosis not present

## 2017-04-10 DIAGNOSIS — M531 Cervicobrachial syndrome: Secondary | ICD-10-CM | POA: Diagnosis not present

## 2017-04-16 ENCOUNTER — Ambulatory Visit: Payer: BLUE CROSS/BLUE SHIELD | Admitting: Physician Assistant

## 2017-04-16 DIAGNOSIS — M5386 Other specified dorsopathies, lumbar region: Secondary | ICD-10-CM | POA: Diagnosis not present

## 2017-04-16 DIAGNOSIS — M531 Cervicobrachial syndrome: Secondary | ICD-10-CM | POA: Diagnosis not present

## 2017-04-16 DIAGNOSIS — M9901 Segmental and somatic dysfunction of cervical region: Secondary | ICD-10-CM | POA: Diagnosis not present

## 2017-04-22 DIAGNOSIS — M531 Cervicobrachial syndrome: Secondary | ICD-10-CM | POA: Diagnosis not present

## 2017-04-22 DIAGNOSIS — M5387 Other specified dorsopathies, lumbosacral region: Secondary | ICD-10-CM | POA: Diagnosis not present

## 2017-04-22 DIAGNOSIS — M9902 Segmental and somatic dysfunction of thoracic region: Secondary | ICD-10-CM | POA: Diagnosis not present

## 2017-04-30 DIAGNOSIS — M9904 Segmental and somatic dysfunction of sacral region: Secondary | ICD-10-CM | POA: Diagnosis not present

## 2017-04-30 DIAGNOSIS — M9903 Segmental and somatic dysfunction of lumbar region: Secondary | ICD-10-CM | POA: Diagnosis not present

## 2017-04-30 DIAGNOSIS — M531 Cervicobrachial syndrome: Secondary | ICD-10-CM | POA: Diagnosis not present

## 2017-05-15 ENCOUNTER — Ambulatory Visit: Payer: BLUE CROSS/BLUE SHIELD | Admitting: Obstetrics and Gynecology

## 2017-06-18 ENCOUNTER — Other Ambulatory Visit: Payer: Self-pay | Admitting: Physician Assistant

## 2017-06-18 ENCOUNTER — Other Ambulatory Visit: Payer: Self-pay | Admitting: Obstetrics and Gynecology

## 2017-06-18 DIAGNOSIS — Z1231 Encounter for screening mammogram for malignant neoplasm of breast: Secondary | ICD-10-CM

## 2017-06-27 ENCOUNTER — Telehealth: Payer: Self-pay | Admitting: Physician Assistant

## 2017-06-27 DIAGNOSIS — B379 Candidiasis, unspecified: Secondary | ICD-10-CM

## 2017-06-27 MED ORDER — FLUCONAZOLE 150 MG PO TABS: 150.0000 mg | ORAL_TABLET | Freq: Once | ORAL | 0 refills | Status: AC

## 2017-06-27 NOTE — Telephone Encounter (Signed)
Please review-aa 

## 2017-06-27 NOTE — Telephone Encounter (Signed)
Pt states she is having burning, itching and a discharge in her vaginal area.  Pt is requesting a Rx to help with this without coming in for an appointment.  [facility].  UU#[phone]/[id number]

## 2017-06-27 NOTE — Telephone Encounter (Signed)
Pt advised-aa 

## 2017-06-27 NOTE — Telephone Encounter (Signed)
Will send Diflucan. If no improvements will need appt

## 2017-06-28 DIAGNOSIS — M9903 Segmental and somatic dysfunction of lumbar region: Secondary | ICD-10-CM | POA: Diagnosis not present

## 2017-06-28 DIAGNOSIS — M531 Cervicobrachial syndrome: Secondary | ICD-10-CM | POA: Diagnosis not present

## 2017-07-04 DIAGNOSIS — M9903 Segmental and somatic dysfunction of lumbar region: Secondary | ICD-10-CM | POA: Diagnosis not present

## 2017-07-04 DIAGNOSIS — M4608 Spinal enthesopathy, sacral and sacrococcygeal region: Secondary | ICD-10-CM | POA: Diagnosis not present

## 2017-07-04 DIAGNOSIS — M9904 Segmental and somatic dysfunction of sacral region: Secondary | ICD-10-CM | POA: Diagnosis not present

## 2017-07-04 DIAGNOSIS — M4607 Spinal enthesopathy, lumbosacral region: Secondary | ICD-10-CM | POA: Diagnosis not present

## 2017-07-09 ENCOUNTER — Ambulatory Visit: Payer: BLUE CROSS/BLUE SHIELD | Admitting: Obstetrics and Gynecology

## 2017-07-11 ENCOUNTER — Ambulatory Visit (INDEPENDENT_AMBULATORY_CARE_PROVIDER_SITE_OTHER): Payer: BLUE CROSS/BLUE SHIELD | Admitting: Vascular Surgery

## 2017-07-11 ENCOUNTER — Encounter (INDEPENDENT_AMBULATORY_CARE_PROVIDER_SITE_OTHER): Payer: BLUE CROSS/BLUE SHIELD

## 2017-07-15 ENCOUNTER — Ambulatory Visit (INDEPENDENT_AMBULATORY_CARE_PROVIDER_SITE_OTHER): Payer: BLUE CROSS/BLUE SHIELD | Admitting: Obstetrics and Gynecology

## 2017-07-15 ENCOUNTER — Encounter: Payer: Self-pay | Admitting: Obstetrics and Gynecology

## 2017-07-15 VITALS — BP 130/80 | HR 78 | Ht 62.0 in | Wt 234.0 lb

## 2017-07-15 DIAGNOSIS — Z01419 Encounter for gynecological examination (general) (routine) without abnormal findings: Secondary | ICD-10-CM | POA: Diagnosis not present

## 2017-07-15 DIAGNOSIS — Z1231 Encounter for screening mammogram for malignant neoplasm of breast: Secondary | ICD-10-CM

## 2017-07-15 DIAGNOSIS — Z1239 Encounter for other screening for malignant neoplasm of breast: Secondary | ICD-10-CM

## 2017-07-15 DIAGNOSIS — Z1151 Encounter for screening for human papillomavirus (HPV): Secondary | ICD-10-CM

## 2017-07-15 DIAGNOSIS — R102 Pelvic and perineal pain: Secondary | ICD-10-CM | POA: Diagnosis not present

## 2017-07-15 DIAGNOSIS — Z124 Encounter for screening for malignant neoplasm of cervix: Secondary | ICD-10-CM | POA: Diagnosis not present

## 2017-07-15 NOTE — Progress Notes (Signed)
Mar Daring, PA-C   Chief Complaint  Patient presents with  . Gynecologic Exam     HPI:      Carrie Stokes is a 41 y.o. No obstetric history on file. who LMP was No LMP recorded (exact date). Patient is not currently having periods (Reason: IUD)., presents today for her annual examination.  Her menses are absent due to IUD. Dysmenorrhea mild, occurring premenstrually. She does not have intermenstrual bleeding.  Sex activity: single partner, contraception - IUD.  Mirena placed 04/28/14. She does have dyspareunia and occas pelvic pain with IUD. She has episodic constipation, cramping, too. No bleeding with sex.   Last Pap: May 28, 2016  Results were: no abnormalities /POS HPV DNA. She has a hx of abn paps, last colpo 1/17. Due for repeat pap today. Hx of STDs: HPV, HSV  Last mammogram: scheduled for tomorrow.  There is a FH of breast cancer in her mat great aunt, genetic testing not indicated. There is no FH of ovarian cancer. The patient does do self-breast exams.  Tobacco use: The patient denies current or previous tobacco use. Alcohol use: social drinker No drug use.  Exercise: moderately active  She does get adequate calcium and Vitamin D in her diet.  Since her last annual GYN exam, she has not had any significant changes in her health history.    Past Medical History:  Diagnosis Date  . Anemia   . Anxiety     Past Surgical History:  Procedure Laterality Date  . APPENDECTOMY  2005  . CHOLECYSTECTOMY  2005   Dr. Jamal Collin  . GASTRIC BYPASS    . KNEE ARTHROSCOPY W/ ACL RECONSTRUCTION Right 07/13/2015   Dr. Para March (miniscus)    Family History  Problem Relation Age of Onset  . Asthma Mother   . Lung cancer Maternal Grandmother   . Colon cancer Maternal Aunt 60       stage 4, also lung and liver cancer    Social History   Social History  . Marital status: Married    Spouse name: N/A  . Number of children: N/A  . Years of education: N/A     Occupational History  . Not on file.   Social History Main Topics  . Smoking status: Never Smoker  . Smokeless tobacco: Never Used  . Alcohol use Yes     Comment: occasional  . Drug use: No  . Sexual activity: Yes    Birth control/ protection: IUD   Other Topics Concern  . Not on file   Social History Narrative  . No narrative on file    Current Meds  Medication Sig  . ALPRAZolam (XANAX) 0.5 MG tablet Take 1 tablet (0.5 mg total) by mouth 2 (two) times daily as needed for anxiety.  . ferrous gluconate (FERGON) 240 (27 FE) MG tablet TAKE 1 TABLET BY MOUTH 2 TIMES A DAY  . levonorgestrel (MIRENA, 52 MG,) 20 MCG/24HR IUD by Intrauterine route.  . Multiple Vitamin (MULTIVITAMINS PO) Take by mouth.  . traZODone (DESYREL) 50 MG tablet Take 1 tablet (50 mg total) by mouth at bedtime.     ROS:  Review of Systems  Constitutional: Negative for fatigue, fever and unexpected weight change.  Respiratory: Negative for cough, shortness of breath and wheezing.   Cardiovascular: Negative for chest pain, palpitations and leg swelling.  Gastrointestinal: Negative for blood in stool, constipation, diarrhea, nausea and vomiting.  Endocrine: Negative for cold intolerance, heat intolerance and polyuria.  Genitourinary: Positive for dyspareunia. Negative for dysuria, flank pain, frequency, genital sores, hematuria, menstrual problem, pelvic pain, urgency, vaginal bleeding, vaginal discharge and vaginal pain.  Musculoskeletal: Negative for back pain, joint swelling and myalgias.  Skin: Negative for rash.  Neurological: Negative for dizziness, syncope, light-headedness, numbness and headaches.  Hematological: Negative for adenopathy.  Psychiatric/Behavioral: Negative for agitation, confusion, sleep disturbance and suicidal ideas. The patient is not nervous/anxious.      Objective: BP 130/80   Pulse 78   Ht 5\' 2"  (1.575 m)   Wt 234 lb (106.1 kg)   LMP  (Exact Date)   BMI 42.80 kg/m     Physical Exam  Constitutional: She is oriented to person, place, and time. She appears well-developed and well-nourished.  Genitourinary: Vagina normal. There is no rash or tenderness on the right labia. There is no rash or tenderness on the left labia. No erythema or tenderness in the vagina. No vaginal discharge found. Right adnexum does not display mass and does not display tenderness. Left adnexum does not display mass and does not display tenderness.  Cervix exhibits visible IUD strings. Cervix does not exhibit motion tenderness or polyp.   Uterus is tender. Uterus is not enlarged.  Neck: Normal range of motion. No thyromegaly present.  Cardiovascular: Normal rate, regular rhythm and normal heart sounds.   No murmur heard. Pulmonary/Chest: Effort normal and breath sounds normal. Right breast exhibits no mass, no nipple discharge, no skin change and no tenderness. Left breast exhibits no mass, no nipple discharge, no skin change and no tenderness.  Abdominal: Soft. There is tenderness in the suprapubic area. There is no guarding.  Musculoskeletal: Normal range of motion.  Neurological: She is alert and oriented to person, place, and time. No cranial nerve deficit.  Psychiatric: She has a normal mood and affect. Her behavior is normal.  Vitals reviewed.   Assessment/Plan: Encounter for annual routine gynecological examination  Cervical cancer screening - Plan: IGP, Aptima HPV  Screening for HPV (human papillomavirus) - Will call pt with results due to hx of abn. If abn, pt due to for colpo. - Plan: IGP, Aptima HPV  Screening for breast cancer  Pelvic pain - Tender on abd and pelvic exam. IUD strings in place but question correct location. Offered u/s. Pt to f/u if desires.            GYN counsel breast self exam, mammography screening, adequate intake of calcium and vitamin D     F/U  Return in about 1 year (around 07/15/2018).  Luciann Gossett B. Leara Rawl, PA-C 07/15/2017 8:45 AM

## 2017-07-16 ENCOUNTER — Ambulatory Visit
Admission: RE | Admit: 2017-07-16 | Discharge: 2017-07-16 | Disposition: A | Discharge status: Home / Self Care | Payer: BLUE CROSS/BLUE SHIELD | Source: Ambulatory Visit | Attending: Obstetrics and Gynecology | Admitting: Obstetrics and Gynecology

## 2017-07-16 DIAGNOSIS — R928 Other abnormal and inconclusive findings on diagnostic imaging of breast: Secondary | ICD-10-CM | POA: Diagnosis not present

## 2017-07-16 DIAGNOSIS — Z1231 Encounter for screening mammogram for malignant neoplasm of breast: Secondary | ICD-10-CM

## 2017-07-17 ENCOUNTER — Other Ambulatory Visit: Payer: Self-pay | Admitting: Obstetrics and Gynecology

## 2017-07-17 DIAGNOSIS — R928 Other abnormal and inconclusive findings on diagnostic imaging of breast: Secondary | ICD-10-CM

## 2017-07-17 DIAGNOSIS — N6489 Other specified disorders of breast: Secondary | ICD-10-CM

## 2017-07-17 LAB — IGP, APTIMA HPV
HPV Aptima: NEGATIVE
PAP Smear Comment: 0

## 2017-07-22 ENCOUNTER — Other Ambulatory Visit: Payer: Self-pay | Admitting: Physician Assistant

## 2017-07-22 DIAGNOSIS — F5101 Primary insomnia: Secondary | ICD-10-CM

## 2017-07-23 DIAGNOSIS — M9903 Segmental and somatic dysfunction of lumbar region: Secondary | ICD-10-CM | POA: Diagnosis not present

## 2017-07-23 DIAGNOSIS — M4607 Spinal enthesopathy, lumbosacral region: Secondary | ICD-10-CM | POA: Diagnosis not present

## 2017-07-23 DIAGNOSIS — M4608 Spinal enthesopathy, sacral and sacrococcygeal region: Secondary | ICD-10-CM | POA: Diagnosis not present

## 2017-07-23 DIAGNOSIS — M9904 Segmental and somatic dysfunction of sacral region: Secondary | ICD-10-CM | POA: Diagnosis not present

## 2017-07-29 ENCOUNTER — Encounter (INDEPENDENT_AMBULATORY_CARE_PROVIDER_SITE_OTHER): Payer: Self-pay | Admitting: Vascular Surgery

## 2017-07-29 ENCOUNTER — Ambulatory Visit (INDEPENDENT_AMBULATORY_CARE_PROVIDER_SITE_OTHER): Payer: BLUE CROSS/BLUE SHIELD

## 2017-07-29 ENCOUNTER — Ambulatory Visit (INDEPENDENT_AMBULATORY_CARE_PROVIDER_SITE_OTHER): Payer: BLUE CROSS/BLUE SHIELD | Admitting: Vascular Surgery

## 2017-07-29 VITALS — BP 124/84 | HR 79 | Resp 16 | Ht 63.0 in | Wt 237.0 lb

## 2017-07-29 DIAGNOSIS — I83813 Varicose veins of bilateral lower extremities with pain: Secondary | ICD-10-CM

## 2017-07-29 DIAGNOSIS — I890 Lymphedema, not elsewhere classified: Secondary | ICD-10-CM

## 2017-07-29 DIAGNOSIS — I872 Venous insufficiency (chronic) (peripheral): Secondary | ICD-10-CM | POA: Diagnosis not present

## 2017-08-01 ENCOUNTER — Other Ambulatory Visit: Payer: BLUE CROSS/BLUE SHIELD

## 2017-08-01 ENCOUNTER — Ambulatory Visit: Payer: BLUE CROSS/BLUE SHIELD

## 2017-08-03 DIAGNOSIS — I83819 Varicose veins of unspecified lower extremities with pain: Secondary | ICD-10-CM | POA: Insufficient documentation

## 2017-08-03 NOTE — Progress Notes (Signed)
MRN : [id number]  Carrie Stokes is a 41 y.o. ([birth year]) female who presents with chief complaint of  Chief Complaint  Patient presents with  . Follow-up    3 month u/s  .  History of Present Illness: The patient returns for followup evaluation 3 months after the initial visit. The patient continues to have pain in the lower extremities with dependency. The pain is lessened with elevation. Graduated compression stockings, Class I (20-30 mmHg), have been worn but the stockings do not eliminate the leg pain. Over-the-counter analgesics do not improve the symptoms. The degree of discomfort continues to interfere with daily activities. The patient notes the pain in the legs is causing problems with daily exercise, at the workplace and even with household activities and maintenance such as standing in the kitchen preparing meals and doing dishes.   Venous ultrasound shows normal deep venous system, no evidence of acute or chronic DVT.  Superficial reflux is present in the left great saphenous vein  Current Meds  Medication Sig  . ALPRAZolam (XANAX) 0.5 MG tablet Take 1 tablet (0.5 mg total) by mouth 2 (two) times daily as needed for anxiety.  . ferrous gluconate (FERGON) 240 (27 FE) MG tablet TAKE 1 TABLET BY MOUTH 2 TIMES A DAY  . levonorgestrel (MIRENA, 52 MG,) 20 MCG/24HR IUD by Intrauterine route.  . Multiple Vitamin (MULTIVITAMINS PO) Take by mouth.  . traZODone (DESYREL) 50 MG tablet TAKE 1 TABLET (50 MG TOTAL) BY MOUTH AT BEDTIME.    Past Medical History:  Diagnosis Date  . Anemia   . Anxiety     Past Surgical History:  Procedure Laterality Date  . APPENDECTOMY  2005  . CHOLECYSTECTOMY  2005   Dr. Jamal Collin  . GASTRIC BYPASS    . KNEE ARTHROSCOPY W/ ACL RECONSTRUCTION Right 07/13/2015   Dr. Para March (miniscus)    Social History Social History  Substance Use Topics  . Smoking status: Never Smoker  . Smokeless tobacco: Never Used  . Alcohol use Yes     Comment:  occasional    Family History Family History  Problem Relation Age of Onset  . Asthma Mother   . Lung cancer Maternal Grandmother   . Colon cancer Maternal Aunt 60       stage 4, also lung and liver cancer  . Breast cancer Neg Hx     Allergies  Allergen Reactions  . Pseudoephedrine Hives     REVIEW OF SYSTEMS (Negative unless checked)  Constitutional: [] Weight loss  [] Fever  [] Chills Cardiac: [] Chest pain   [] Chest pressure   [] Palpitations   [] Shortness of breath when laying flat   [] Shortness of breath with exertion. Vascular:  [] Pain in legs with walking   [x] Pain in legs with standing  [] History of DVT   [] Phlebitis   [x] Swelling in legs   [x] Varicose veins   [] Non-healing ulcers Pulmonary:   [] Uses home oxygen   [] Productive cough   [] Hemoptysis   [] Wheeze  [] COPD   [] Asthma Neurologic:  [] Dizziness   [] Seizures   [] History of stroke   [] History of TIA  [] Aphasia   [] Vissual changes   [] Weakness or numbness in arm   [] Weakness or numbness in leg Musculoskeletal:   [] Joint swelling   [] Joint pain   [] Low back pain Hematologic:  [] Easy bruising  [] Easy bleeding   [] Hypercoagulable state   [] Anemic Gastrointestinal:  [] Diarrhea   [] Vomiting  [] Gastroesophageal reflux/heartburn   [] Difficulty swallowing. Genitourinary:  [] Chronic kidney disease   [] Difficult  urination  [] Frequent urination   [] Blood in urine Skin:  [] Rashes   [] Ulcers  Psychological:  [] History of anxiety   []  History of major depression.  Physical Examination  Vitals:   07/29/17 1631  BP: 124/84  Pulse: 79  Resp: 16  Weight: 237 lb (107.5 kg)  Height: 5\' 3"  (1.6 m)   Body mass index is 41.98 kg/m. Gen: WD/WN, NAD Head: [place]/AT, No temporalis wasting.  Ear/Nose/Throat: Hearing grossly intact, nares w/o erythema or drainage Eyes: PER, EOMI, sclera nonicteric.  Neck: Supple, no large masses.   Pulmonary:  Good air movement, no audible wheezing bilaterally, no use of accessory muscles.  Cardiac: RRR, no  JVD Vascular: Large varicosities present extensively greater than 10 mm left leg.  Mild venous stasis changes to the legs bilaterally.  2-3+ soft pitting edema Vessel Right Left  Radial Palpable Palpable  PT Palpable Palpable  DP Palpable Palpable  Gastrointestinal: Non-distended. No guarding/no peritoneal signs.  Musculoskeletal: M/S 5/5 throughout.  No deformity or atrophy.  Neurologic: CN 2-12 intact. Symmetrical.  Speech is fluent. Motor exam as listed above. Psychiatric: Judgment intact, Mood & affect appropriate for pt's clinical situation. Dermatologic: venous rashes no ulcers noted.  No changes consistent with cellulitis. Lymph : No lichenification or skin changes of chronic lymphedema.  CBC Lab Results  Component Value Date   WBC 4.2 08/31/2015   HGB 12.2 08/31/2015   HCT 36 08/31/2015   PLT 272 08/31/2015    BMET    Component Value Date/Time   NA 139 08/31/2015   K 4.5 08/31/2015   BUN 9 08/31/2015   CREATININE 1.0 08/31/2015   CrCl cannot be calculated (Patient's most recent lab result is older than the maximum 21 days allowed.).  COAG No results found for: INR, PROTIME  Radiology Mm Digital Screening Bilateral  Result Date: 07/16/2017 CLINICAL DATA:  Screening. EXAM: DIGITAL SCREENING BILATERAL MAMMOGRAM WITH CAD COMPARISON:  None ACR Breast Density Category b: There are scattered areas of fibroglandular density. FINDINGS: In the right breast, a possible asymmetry warrants further evaluation. In the left breast, no findings suspicious for malignancy. Images were processed with CAD. IMPRESSION: Further evaluation is suggested for possible asymmetry in the right breast. RECOMMENDATION: Diagnostic mammogram and possibly ultrasound of the right breast. (Code:[id number]) The patient will be contacted regarding the findings, and additional imaging will be scheduled. BI-RADS CATEGORY  0: Incomplete. Need additional imaging evaluation and/or prior mammograms for comparison.  Electronically Signed   By: Lovey Newcomer M.D.   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 Patient will continue  wearing graduated compression stockings class 1 on a daily basis, beginning first thing in the morning and removing them in the evening.    In addition, behavioral modification including elevation during the day was again discussed and this will continue.  The patient has utilized over the counter pain medications and has been exercising.  However, at this time conservative therapy has not alleviated the patient's symptoms of leg pain and swelling  Recommend: laser ablation of the  left great saphenous veins to eliminate the symptoms of pain and swelling of the lower extremities caused by the severe superficial venous reflux disease.   2. Chronic venous insufficiency No surgery or intervention at this point in time.    I have had a long discussion with  the patient regarding venous insufficiency and why it  causes symptoms. I have discussed with the patient the chronic skin changes that accompany venous insufficiency and the long term sequela such as infection and ulceration.  Patient will begin wearing graduated compression stockings class 1 (20-30 mmHg) or compression wraps on a daily basis a prescription was given. The patient will put the stockings on first thing in the morning and removing them in the evening. The patient is instructed specifically not to sleep in the stockings.    In addition, behavioral modification including several periods of elevation of the lower extremities during the day will be continued. I have demonstrated that proper elevation is a position with the ankles at heart level.  The patient is instructed to begin routine exercise, especially walking on a daily basis  Following  the review of the ultrasound the patient will follow up in 2-3 months to reassess the degree of swelling and the control that graduated compression stockings or compression wraps  is offering.   The patient can be assessed for a Lymph Pump at that time  3. Lymphedema See #1&2    Hortencia Pilar, MD  08/03/2017 2:42 PM

## 2017-08-19 ENCOUNTER — Other Ambulatory Visit: Payer: BLUE CROSS/BLUE SHIELD

## 2017-09-18 NOTE — Progress Notes (Signed)
    MRN : [id number]  Carrie Stokes is a 41 y.o. (15-Jan-1976) female who presents with chief complaint of painful varicsoe veins.    The patient's left lower extremity was sterilely prepped and draped.  The ultrasound machine was used to visualize the left great saphenous vein saphenous vein throughout its course.  A segment below the knee was selected for access.  The saphenous vein was accessed without difficulty using ultrasound guidance with a micropuncture needle.   An 0.018  wire was placed beyond the saphenofemoral junction through the sheath and the microneedle was removed.  The 65 cm sheath was then placed over the wire and the wire and dilator were removed.  The laser fiber was placed through the sheath and its tip was placed approximately 2 cm below the saphenofemoral junction.  Tumescent anesthesia was then created with a dilute lidocaine solution.  Laser energy was then delivered with constant withdrawal of the sheath and laser fiber.  Approximately 1239 Joules of energy were delivered over a length of 43 cm.  Sterile dressings were placed.  The patient tolerated the procedure well without complications.

## 2017-09-19 ENCOUNTER — Encounter (INDEPENDENT_AMBULATORY_CARE_PROVIDER_SITE_OTHER): Payer: Self-pay | Admitting: Vascular Surgery

## 2017-09-19 ENCOUNTER — Ambulatory Visit (INDEPENDENT_AMBULATORY_CARE_PROVIDER_SITE_OTHER): Payer: BLUE CROSS/BLUE SHIELD | Admitting: Vascular Surgery

## 2017-09-19 VITALS — BP 139/86 | HR 77 | Resp 16 | Ht 63.0 in | Wt 232.0 lb

## 2017-09-19 DIAGNOSIS — I872 Venous insufficiency (chronic) (peripheral): Secondary | ICD-10-CM

## 2017-09-19 DIAGNOSIS — I83819 Varicose veins of unspecified lower extremities with pain: Secondary | ICD-10-CM | POA: Diagnosis not present

## 2017-09-23 ENCOUNTER — Ambulatory Visit (INDEPENDENT_AMBULATORY_CARE_PROVIDER_SITE_OTHER): Payer: BLUE CROSS/BLUE SHIELD

## 2017-09-23 ENCOUNTER — Other Ambulatory Visit (INDEPENDENT_AMBULATORY_CARE_PROVIDER_SITE_OTHER): Payer: Self-pay | Admitting: Vascular Surgery

## 2017-09-23 DIAGNOSIS — I83819 Varicose veins of unspecified lower extremities with pain: Secondary | ICD-10-CM

## 2017-10-15 ENCOUNTER — Other Ambulatory Visit: Payer: Self-pay | Admitting: Physician Assistant

## 2017-10-15 DIAGNOSIS — F5101 Primary insomnia: Secondary | ICD-10-CM

## 2017-10-15 MED ORDER — TRAZODONE HCL 50 MG PO TABS: 50.0000 mg | ORAL_TABLET | Freq: Every day | ORAL | 1 refills | Status: DC

## 2017-10-15 NOTE — Telephone Encounter (Signed)
[facility] faxed a request for a 90-days supply for the following medication. Thanks CC  traZODone (DESYREL) 50 MG tablet

## 2017-10-16 NOTE — Progress Notes (Signed)
MRN : [id number]  Carrie Stokes is a 41 y.o. (1976-02-06) female who presents with chief complaint of painful varicose veins.  History of Present Illness:   The patient returns to the office for followup status post laser ablation of the left great saphenous vein on 09/19/2017.  The patient note significant improvement in the lower extremity pain but not resolution of the symptoms. The patient notes multiple residual varicosities bilaterally which continued to hurt with dependent positions and remained tender to palpation. The patient's swelling is minimally from preoperative status. The patient continues to wear graduated compression stockings on a daily basis but these are not eliminating the pain and discomfort. The patient continues to use over-the-counter anti-inflammatory medications to treat the pain and related symptoms but this has not given the patient relief. The patient notes the pain in the lower extremities is causing problems with daily exercise, problems at work and even with household activities such as preparing meals and doing dishes.  The patient is otherwise done well and there have been no complications related to the laser procedure or interval changes in the patient's overall   Post laser ultrasound shows successful ablation of the left great saphenous veins    No outpatient medications have been marked as taking for the 10/17/17 encounter (Appointment) with Delana Meyer, Dolores Lory, MD.    Past Medical History:  Diagnosis Date  . Anemia   . Anxiety     Past Surgical History:  Procedure Laterality Date  . APPENDECTOMY  2005  . CHOLECYSTECTOMY  2005   Dr. Jamal Collin  . GASTRIC BYPASS    . KNEE ARTHROSCOPY W/ ACL RECONSTRUCTION Right 07/13/2015   Dr. Para March (miniscus)    Social History Social History   Tobacco Use  . Smoking status: Never Smoker  . Smokeless tobacco: Never Used  Substance Use Topics  . Alcohol use: Yes    Comment: occasional  . Drug  use: No    Family History Family History  Problem Relation Age of Onset  . Asthma Mother   . Lung cancer Maternal Grandmother   . Colon cancer Maternal Aunt 60       stage 4, also lung and liver cancer  . Breast cancer Neg Hx     Allergies  Allergen Reactions  . Pseudoephedrine Hives     REVIEW OF SYSTEMS (Negative unless checked)  Constitutional: [] Weight loss  [] Fever  [] Chills Cardiac: [] Chest pain   [] Chest pressure   [] Palpitations   [] Shortness of breath when laying flat   [] Shortness of breath with exertion. Vascular:  [] Pain in legs with walking   [x] Pain in legs with standing [] History of DVT   [] Phlebitis   [x] Swelling in legs   [x] Varicose veins   [] Non-healing ulcers Pulmonary:   [] Uses home oxygen   [] Productive cough   [] Hemoptysis   [] Wheeze  [] COPD   [] Asthma Neurologic:  [] Dizziness   [] Seizures   [] History of stroke   [] History of TIA  [] Aphasia   [] Vissual changes   [] Weakness or numbness in arm   [] Weakness or numbness in leg Musculoskeletal:   [] Joint swelling   [] Joint pain   [] Low back pain Hematologic:  [] Easy bruising  [] Easy bleeding   [] Hypercoagulable state   [] Anemic Gastrointestinal:  [] Diarrhea   [] Vomiting  [] Gastroesophageal reflux/heartburn   [] Difficulty swallowing. Genitourinary:  [] Chronic kidney disease   [] Difficult urination  [] Frequent urination   [] Blood in urine Skin:  [] Rashes   [] Ulcers  Psychological:  [] History of anxiety   []   History of major depression.  Physical Examination  There were no vitals filed for this visit. There is no height or weight on file to calculate BMI. Gen: WD/WN, NAD Head: [place]/AT, No temporalis wasting.  Ear/Nose/Throat: Hearing grossly intact, nares w/o erythema or drainage Eyes: PER, EOMI, sclera nonicteric.  Neck: Supple, no large masses.   Pulmonary:  Good air movement, no audible wheezing bilaterally, no use of accessory muscles.  Cardiac: RRR, no JVD Vascular: Large varicosities present extensively  greater than 10 mm left.  moderate venous stasis changes to the legs bilaterally.  2-3+ soft pitting edema Vessel Right Left  Radial Palpable Palpable  PT Palpable Palpable  DP Palpable Palpable  Gastrointestinal: Non-distended. No guarding/no peritoneal signs.  Musculoskeletal: M/S 5/5 throughout.  No deformity or atrophy.  Neurologic: CN 2-12 intact. Symmetrical.  Speech is fluent. Motor exam as listed above. Psychiatric: Judgment intact, Mood & affect appropriate for pt's clinical situation. Dermatologic: Venous rashes no ulcers noted.  No changes consistent with cellulitis. Lymph : No lichenification or skin changes of chronic lymphedema.  CBC Lab Results  Component Value Date   WBC 4.2 08/31/2015   HGB 12.2 08/31/2015   HCT 36 08/31/2015   PLT 272 08/31/2015    BMET    Component Value Date/Time   NA 139 08/31/2015   K 4.5 08/31/2015   BUN 9 08/31/2015   CREATININE 1.0 08/31/2015   CrCl cannot be calculated (Patient's most recent lab result is older than the maximum 21 days allowed.).  COAG No results found for: INR, PROTIME  Radiology No results found.  Assessment/Plan 1. Varicose veins with pain Recommend:  The patient has had successful ablation of the previously incompetent left saphenous venous system but still has persistent symptoms of pain and swelling that are having a negative impact on daily life and daily activities.  Patient should undergo injection sclerotherapy to treat the residual varicosities of the left lower extremities.  The risks, benefits and alternative therapies were reviewed in detail with the patient.  All questions were answered.  The patient agrees to proceed with sclerotherapy at their convenience.  The patient will continue wearing the graduated compression stockings and using the over-the-counter pain medications to treat her symptoms.    2. Chronic venous insufficiency No surgery or intervention at this point in time.    I have  had a long discussion with the patient regarding venous insufficiency and why it  causes symptoms. I have discussed with the patient the chronic skin changes that accompany venous insufficiency and the long term sequela such as infection and ulceration.  Patient will begin wearing graduated compression stockings class 1 (20-30 mmHg) or compression wraps on a daily basis a prescription was given. The patient will put the stockings on first thing in the morning and removing them in the evening. The patient is instructed specifically not to sleep in the stockings.    In addition, behavioral modification including several periods of elevation of the lower extremities during the day will be continued. I have demonstrated that proper elevation is a position with the ankles at heart level.  The patient is instructed to begin routine exercise, especially walking on a daily basis  Following the review of the ultrasound the patient will follow up in 2-3 months to reassess the degree of swelling and the control that graduated compression stockings or compression wraps  is offering.   The patient can be assessed for a Lymph Pump at that time  3. Lymphedema No  surgery or intervention at this point in time.    I have had a long discussion with the patient regarding venous insufficiency and why it  causes symptoms. I have discussed with the patient the chronic skin changes that accompany venous insufficiency and the long term sequela such as infection and ulceration.  Patient will begin wearing graduated compression stockings class 1 (20-30 mmHg) or compression wraps on a daily basis a prescription was given. The patient will put the stockings on first thing in the morning and removing them in the evening. The patient is instructed specifically not to sleep in the stockings.    In addition, behavioral modification including several periods of elevation of the lower extremities during the day will be continued. I have  demonstrated that proper elevation is a position with the ankles at heart level.  The patient is instructed to begin routine exercise, especially walking on a daily basis  Following the review of the ultrasound the patient will follow up in 2-3 months to reassess the degree of swelling and the control that graduated compression stockings or compression wraps  is offering.   The patient can be assessed for a Lymph Pump at that time  4. Anxiety, generalized Continue Xanax as already ordered, these medications have been reviewed and there are no changes at this time.     Hortencia Pilar, MD  10/16/2017 10:38 AM

## 2017-10-17 ENCOUNTER — Encounter (INDEPENDENT_AMBULATORY_CARE_PROVIDER_SITE_OTHER): Payer: Self-pay | Admitting: Vascular Surgery

## 2017-10-17 ENCOUNTER — Ambulatory Visit (INDEPENDENT_AMBULATORY_CARE_PROVIDER_SITE_OTHER): Payer: BLUE CROSS/BLUE SHIELD | Admitting: Vascular Surgery

## 2017-10-17 VITALS — BP 133/90 | HR 90 | Resp 16 | Ht 66.0 in | Wt 235.0 lb

## 2017-10-17 DIAGNOSIS — F411 Generalized anxiety disorder: Secondary | ICD-10-CM | POA: Diagnosis not present

## 2017-10-17 DIAGNOSIS — I83819 Varicose veins of unspecified lower extremities with pain: Secondary | ICD-10-CM

## 2017-10-17 DIAGNOSIS — I890 Lymphedema, not elsewhere classified: Secondary | ICD-10-CM | POA: Diagnosis not present

## 2017-10-17 DIAGNOSIS — I872 Venous insufficiency (chronic) (peripheral): Secondary | ICD-10-CM | POA: Diagnosis not present

## 2017-10-18 ENCOUNTER — Other Ambulatory Visit: Payer: Self-pay | Admitting: Physician Assistant

## 2017-10-18 DIAGNOSIS — F411 Generalized anxiety disorder: Secondary | ICD-10-CM

## 2017-10-19 NOTE — Telephone Encounter (Signed)
Prescription for alprazolam 0.5mg  phoned in to [facility].  Thanks,  -Joseline

## 2017-11-14 DIAGNOSIS — M9904 Segmental and somatic dysfunction of sacral region: Secondary | ICD-10-CM | POA: Diagnosis not present

## 2017-11-14 DIAGNOSIS — M7918 Myalgia, other site: Secondary | ICD-10-CM | POA: Diagnosis not present

## 2017-11-14 DIAGNOSIS — M9903 Segmental and somatic dysfunction of lumbar region: Secondary | ICD-10-CM | POA: Diagnosis not present

## 2017-11-18 DIAGNOSIS — M9903 Segmental and somatic dysfunction of lumbar region: Secondary | ICD-10-CM | POA: Diagnosis not present

## 2017-11-18 DIAGNOSIS — M9904 Segmental and somatic dysfunction of sacral region: Secondary | ICD-10-CM | POA: Diagnosis not present

## 2017-11-18 DIAGNOSIS — M7918 Myalgia, other site: Secondary | ICD-10-CM | POA: Diagnosis not present

## 2017-12-09 DIAGNOSIS — L578 Other skin changes due to chronic exposure to nonionizing radiation: Secondary | ICD-10-CM | POA: Diagnosis not present

## 2017-12-09 DIAGNOSIS — L570 Actinic keratosis: Secondary | ICD-10-CM | POA: Diagnosis not present

## 2017-12-09 DIAGNOSIS — D485 Neoplasm of uncertain behavior of skin: Secondary | ICD-10-CM | POA: Diagnosis not present

## 2017-12-17 ENCOUNTER — Ambulatory Visit (INDEPENDENT_AMBULATORY_CARE_PROVIDER_SITE_OTHER): Payer: BLUE CROSS/BLUE SHIELD | Admitting: Vascular Surgery

## 2017-12-17 ENCOUNTER — Encounter (INDEPENDENT_AMBULATORY_CARE_PROVIDER_SITE_OTHER): Payer: Self-pay | Admitting: Vascular Surgery

## 2017-12-17 VITALS — BP 163/96 | HR 87 | Resp 17 | Wt 241.0 lb

## 2017-12-17 DIAGNOSIS — I83819 Varicose veins of unspecified lower extremities with pain: Secondary | ICD-10-CM

## 2017-12-17 NOTE — Progress Notes (Signed)
Varicose veins of left lower extremity with inflammation (454.1  I83.10) Current Plans   Indication: Patient presents with symptomatic varicose veins of the left lower extremity.   Procedure: Sclerotherapy using hypertonic saline mixed with 1% Lidocaine was performed on the left lower extremity. Compression wraps were placed. The patient tolerated the procedure well. 

## 2017-12-17 NOTE — Addendum Note (Signed)
Addended by: Marcelle Overlie A on: 12/17/2017 04:31 PM   Modules accepted: Level of Service

## 2017-12-19 DIAGNOSIS — D485 Neoplasm of uncertain behavior of skin: Secondary | ICD-10-CM | POA: Diagnosis not present

## 2017-12-19 DIAGNOSIS — L814 Other melanin hyperpigmentation: Secondary | ICD-10-CM | POA: Diagnosis not present

## 2017-12-23 DIAGNOSIS — D485 Neoplasm of uncertain behavior of skin: Secondary | ICD-10-CM | POA: Diagnosis not present

## 2018-01-06 DIAGNOSIS — D229 Melanocytic nevi, unspecified: Secondary | ICD-10-CM | POA: Diagnosis not present

## 2018-01-06 DIAGNOSIS — D485 Neoplasm of uncertain behavior of skin: Secondary | ICD-10-CM | POA: Diagnosis not present

## 2018-01-13 ENCOUNTER — Ambulatory Visit (INDEPENDENT_AMBULATORY_CARE_PROVIDER_SITE_OTHER): Payer: BLUE CROSS/BLUE SHIELD | Admitting: Vascular Surgery

## 2018-01-13 ENCOUNTER — Encounter (INDEPENDENT_AMBULATORY_CARE_PROVIDER_SITE_OTHER): Payer: Self-pay | Admitting: Vascular Surgery

## 2018-01-13 VITALS — BP 136/89 | HR 71 | Resp 16 | Ht 63.0 in | Wt 240.0 lb

## 2018-01-13 DIAGNOSIS — M9901 Segmental and somatic dysfunction of cervical region: Secondary | ICD-10-CM | POA: Diagnosis not present

## 2018-01-13 DIAGNOSIS — I83819 Varicose veins of unspecified lower extremities with pain: Secondary | ICD-10-CM

## 2018-01-13 DIAGNOSIS — M9904 Segmental and somatic dysfunction of sacral region: Secondary | ICD-10-CM | POA: Diagnosis not present

## 2018-01-13 DIAGNOSIS — M7918 Myalgia, other site: Secondary | ICD-10-CM | POA: Diagnosis not present

## 2018-01-13 DIAGNOSIS — G44219 Episodic tension-type headache, not intractable: Secondary | ICD-10-CM | POA: Diagnosis not present

## 2018-01-14 ENCOUNTER — Encounter (INDEPENDENT_AMBULATORY_CARE_PROVIDER_SITE_OTHER): Payer: Self-pay | Admitting: Vascular Surgery

## 2018-01-22 ENCOUNTER — Encounter (INDEPENDENT_AMBULATORY_CARE_PROVIDER_SITE_OTHER): Payer: Self-pay | Admitting: Vascular Surgery

## 2018-01-22 DIAGNOSIS — D485 Neoplasm of uncertain behavior of skin: Secondary | ICD-10-CM | POA: Diagnosis not present

## 2018-01-22 DIAGNOSIS — D225 Melanocytic nevi of trunk: Secondary | ICD-10-CM | POA: Diagnosis not present

## 2018-01-27 ENCOUNTER — Ambulatory Visit (INDEPENDENT_AMBULATORY_CARE_PROVIDER_SITE_OTHER): Payer: BLUE CROSS/BLUE SHIELD | Admitting: Vascular Surgery

## 2018-02-05 DIAGNOSIS — D2262 Melanocytic nevi of left upper limb, including shoulder: Secondary | ICD-10-CM | POA: Diagnosis not present

## 2018-02-05 DIAGNOSIS — D485 Neoplasm of uncertain behavior of skin: Secondary | ICD-10-CM | POA: Diagnosis not present

## 2018-02-07 ENCOUNTER — Other Ambulatory Visit: Payer: Self-pay | Admitting: *Deleted

## 2018-02-07 DIAGNOSIS — B379 Candidiasis, unspecified: Secondary | ICD-10-CM

## 2018-02-07 MED ORDER — FLUCONAZOLE 150 MG PO TABS: 150.0000 mg | ORAL_TABLET | Freq: Once | ORAL | 0 refills | Status: AC

## 2018-02-07 NOTE — Addendum Note (Signed)
Addended by: Mar Daring on: 02/07/2018 02:25 PM   Modules accepted: Orders

## 2018-02-07 NOTE — Telephone Encounter (Signed)
lm

## 2018-02-07 NOTE — Telephone Encounter (Signed)
Sent in diflucan

## 2018-02-07 NOTE — Telephone Encounter (Signed)
Patient called and states that she is experiencing sinus issues and a poss yeast infection . Patient is wondering if Carrie Stokes can call her something in for the yeast infection . Patient is going to try OTC medication for the sinus issues. [facility] [place]. Please advise. Thank you

## 2018-04-16 DIAGNOSIS — M7918 Myalgia, other site: Secondary | ICD-10-CM | POA: Diagnosis not present

## 2018-04-16 DIAGNOSIS — M9903 Segmental and somatic dysfunction of lumbar region: Secondary | ICD-10-CM | POA: Diagnosis not present

## 2018-04-16 DIAGNOSIS — M5127 Other intervertebral disc displacement, lumbosacral region: Secondary | ICD-10-CM | POA: Diagnosis not present

## 2018-05-02 ENCOUNTER — Other Ambulatory Visit: Payer: Self-pay | Admitting: Physician Assistant

## 2018-05-02 DIAGNOSIS — F411 Generalized anxiety disorder: Secondary | ICD-10-CM

## 2018-06-17 DIAGNOSIS — L570 Actinic keratosis: Secondary | ICD-10-CM | POA: Diagnosis not present

## 2018-06-17 DIAGNOSIS — L578 Other skin changes due to chronic exposure to nonionizing radiation: Secondary | ICD-10-CM | POA: Diagnosis not present

## 2018-06-17 DIAGNOSIS — L718 Other rosacea: Secondary | ICD-10-CM | POA: Diagnosis not present

## 2018-06-17 DIAGNOSIS — Z1283 Encounter for screening for malignant neoplasm of skin: Secondary | ICD-10-CM | POA: Diagnosis not present

## 2018-06-18 ENCOUNTER — Ambulatory Visit: Payer: BLUE CROSS/BLUE SHIELD | Admitting: Physician Assistant

## 2018-06-18 ENCOUNTER — Encounter: Payer: Self-pay | Admitting: Physician Assistant

## 2018-06-18 VITALS — BP 128/88 | HR 80 | Temp 98.0°F | Resp 16 | Wt 238.4 lb

## 2018-06-18 DIAGNOSIS — B3731 Acute candidiasis of vulva and vagina: Secondary | ICD-10-CM

## 2018-06-18 DIAGNOSIS — B373 Candidiasis of vulva and vagina: Secondary | ICD-10-CM

## 2018-06-18 MED ORDER — FLUCONAZOLE 150 MG PO TABS
150.0000 mg | ORAL_TABLET | Freq: Once | ORAL | 0 refills | Status: AC
Start: 2018-06-18 — End: 2018-06-18

## 2018-06-18 MED ORDER — TERCONAZOLE 0.8 % VA CREA: 1.0000 | TOPICAL_CREAM | Freq: Every day | VAGINAL | 0 refills | Status: DC

## 2018-06-18 NOTE — Patient Instructions (Signed)

## 2018-06-18 NOTE — Progress Notes (Signed)
       Patient: Carrie Stokes Female    DOB: [birth year]   42 y.o.   MRN: [id number] Visit Date: 06/18/2018  Today's Provider: Mar Daring, PA-C   Chief Complaint  Patient presents with  . Vaginitis   Subjective:    HPI Patient reports that she is having burning and she reports that it comes from intimacy. She reports that is going to [place] and would like to have Diflucan. She reports she tried: Terconazole 0.8% vaginal cream.     Allergies  Allergen Reactions  . Pseudoephedrine Hives     Current Outpatient Medications:  .  ALPRAZolam (XANAX) 0.5 MG tablet, TAKE 1 TABLET BY MOUTH TWICE A DAY AS NEEDED FOR ANXIETY, Disp: 60 tablet, Rfl: 5 .  ferrous gluconate (FERGON) 240 (27 FE) MG tablet, TAKE 1 TABLET BY MOUTH 2 TIMES A DAY, Disp: 100 tablet, Rfl: 5 .  levonorgestrel (MIRENA, 52 MG,) 20 MCG/24HR IUD, by Intrauterine route., Disp: , Rfl:  .  Multiple Vitamin (MULTIVITAMINS PO), Take by mouth., Disp: , Rfl:  .  traZODone (DESYREL) 50 MG tablet, Take 1 tablet (50 mg total) by mouth at bedtime., Disp: 90 tablet, Rfl: 1  Review of Systems  Constitutional: Negative.   Respiratory: Negative.   Cardiovascular: Negative.   Gastrointestinal: Negative.   Genitourinary: Negative.     Social History   Tobacco Use  . Smoking status: Never Smoker  . Smokeless tobacco: Never Used  Substance Use Topics  . Alcohol use: Yes    Comment: occasional   Objective:   BP 128/88 (BP Location: Left Arm, Patient Position: Sitting, Cuff Size: Normal)   Pulse 80   Temp 98 F (36.7 C) (Oral)   Resp 16   Wt 238 lb 6.4 oz (108.1 kg)   BMI 42.23 kg/m  Vitals:   06/18/18 1118  BP: 128/88  Pulse: 80  Resp: 16  Temp: 98 F (36.7 C)  TempSrc: Oral  Weight: 238 lb 6.4 oz (108.1 kg)     Physical Exam  Constitutional: She appears well-developed and well-nourished. No distress.  Neck: Normal range of motion. Neck supple.  Cardiovascular: Normal rate, regular rhythm and  normal heart sounds. Exam reveals no gallop and no friction rub.  No murmur heard. Pulmonary/Chest: Effort normal and breath sounds normal. No respiratory distress. She has no wheezes. She has no rales.  Genitourinary: Pelvic exam was performed with patient supine. No labial fusion. There is rash and tenderness on the right labia. There is no lesion or injury on the right labia. There is rash and tenderness on the left labia. There is no lesion or injury on the left labia.  Genitourinary Comments: Erythematous labia majora  Skin: She is not diaphoretic.  Vitals reviewed.       Assessment & Plan:     1. Yeast vaginitis Will give Terazol cream as below for external irritation to use following intercourse. May add hydrocortisone cream for external irritation as well. Diflucan given for [place] vacation.  - fluconazole (DIFLUCAN) 150 MG tablet; Take 1 tablet (150 mg total) by mouth once for 1 dose. May repeat if needed  Dispense: 3 tablet; Refill: 0 - terconazole (TERAZOL 3) 0.8 % vaginal cream; Place 1 applicator vaginally at bedtime.  Dispense: 20 g; Refill: 0       Mar Daring, PA-C  [facility]

## 2018-07-03 DIAGNOSIS — M9903 Segmental and somatic dysfunction of lumbar region: Secondary | ICD-10-CM | POA: Diagnosis not present

## 2018-07-03 DIAGNOSIS — M545 Low back pain: Secondary | ICD-10-CM | POA: Diagnosis not present

## 2018-07-03 DIAGNOSIS — M542 Cervicalgia: Secondary | ICD-10-CM | POA: Diagnosis not present

## 2018-07-03 DIAGNOSIS — M9901 Segmental and somatic dysfunction of cervical region: Secondary | ICD-10-CM | POA: Diagnosis not present

## 2018-07-10 ENCOUNTER — Other Ambulatory Visit: Payer: Self-pay | Admitting: Physician Assistant

## 2018-07-10 DIAGNOSIS — F5101 Primary insomnia: Secondary | ICD-10-CM

## 2018-11-24 ENCOUNTER — Other Ambulatory Visit: Payer: Self-pay | Admitting: Physician Assistant

## 2018-11-24 DIAGNOSIS — F411 Generalized anxiety disorder: Secondary | ICD-10-CM

## 2018-11-24 MED ORDER — ALPRAZOLAM 0.5 MG PO TABS: ORAL_TABLET | ORAL | 5 refills | Status: DC

## 2018-11-24 NOTE — Telephone Encounter (Signed)
Rx sent in.  [place] reviewed.

## 2018-11-26 ENCOUNTER — Other Ambulatory Visit: Payer: Self-pay | Admitting: Obstetrics and Gynecology

## 2018-11-26 DIAGNOSIS — N6489 Other specified disorders of breast: Secondary | ICD-10-CM

## 2018-11-26 DIAGNOSIS — R928 Other abnormal and inconclusive findings on diagnostic imaging of breast: Secondary | ICD-10-CM

## 2018-12-11 ENCOUNTER — Telehealth: Payer: Self-pay | Admitting: Obstetrics and Gynecology

## 2018-12-11 NOTE — Telephone Encounter (Signed)
Patient is schedule 04/20/19 with [facility] for Mirena removal and reinsertion

## 2018-12-24 ENCOUNTER — Ambulatory Visit: Payer: BLUE CROSS/BLUE SHIELD | Admitting: Psychology

## 2018-12-24 DIAGNOSIS — F410 Panic disorder [episodic paroxysmal anxiety] without agoraphobia: Secondary | ICD-10-CM

## 2018-12-25 ENCOUNTER — Other Ambulatory Visit: Payer: Self-pay | Admitting: Physician Assistant

## 2018-12-25 DIAGNOSIS — F5101 Primary insomnia: Secondary | ICD-10-CM

## 2019-01-07 ENCOUNTER — Ambulatory Visit (INDEPENDENT_AMBULATORY_CARE_PROVIDER_SITE_OTHER): Payer: BLUE CROSS/BLUE SHIELD | Admitting: Psychology

## 2019-01-07 DIAGNOSIS — F411 Generalized anxiety disorder: Secondary | ICD-10-CM | POA: Diagnosis not present

## 2019-01-20 ENCOUNTER — Ambulatory Visit: Payer: BLUE CROSS/BLUE SHIELD | Admitting: Psychology

## 2019-01-20 DIAGNOSIS — F410 Panic disorder [episodic paroxysmal anxiety] without agoraphobia: Secondary | ICD-10-CM | POA: Diagnosis not present

## 2019-01-27 ENCOUNTER — Ambulatory Visit
Admission: RE | Admit: 2019-01-27 | Discharge: 2019-01-27 | Disposition: A | Discharge status: Home / Self Care | Payer: BLUE CROSS/BLUE SHIELD | Source: Ambulatory Visit | Attending: Obstetrics and Gynecology | Admitting: Obstetrics and Gynecology

## 2019-01-27 DIAGNOSIS — R928 Other abnormal and inconclusive findings on diagnostic imaging of breast: Secondary | ICD-10-CM

## 2019-01-27 DIAGNOSIS — N6489 Other specified disorders of breast: Secondary | ICD-10-CM

## 2019-01-28 ENCOUNTER — Encounter: Payer: Self-pay | Admitting: Obstetrics and Gynecology

## 2019-02-04 DIAGNOSIS — M25561 Pain in right knee: Secondary | ICD-10-CM | POA: Diagnosis not present

## 2019-02-06 ENCOUNTER — Ambulatory Visit: Payer: BLUE CROSS/BLUE SHIELD | Admitting: Psychology

## 2019-02-11 ENCOUNTER — Ambulatory Visit: Payer: BLUE CROSS/BLUE SHIELD | Admitting: Psychology

## 2019-03-11 ENCOUNTER — Encounter: Payer: Self-pay | Admitting: Obstetrics and Gynecology

## 2019-03-17 ENCOUNTER — Ambulatory Visit: Payer: BLUE CROSS/BLUE SHIELD | Admitting: Psychology

## 2019-04-19 NOTE — Progress Notes (Signed)
Mar Daring, PA-C   Chief Complaint  Patient presents with  . Gynecologic Exam  . Contraception    Mirena removal/reinsertion     HPI:      [name] is a 43 y.o. No obstetric history on file. who LMP was No LMP recorded. (Menstrual status: IUD)., presents today for her annual examination.  Her menses were absent due to IUD but has had random bleeding the past few months. Dysmenorrhea mild, occurring with bleeding.   Sex activity: single partner, contraception - IUD.  Mirena placed 04/28/14. She does have mild dyspareunia with IUD.  Wants IUD replaced today.  Last Pap: 07/15/2017  Results were: no abnormalities /neg HPV DNA. She has a hx of abn paps, last colpo 1/17, last pos HPV 2018. Due for repeat pap today. Hx of STDs: HPV, HSV  Last mammogram: 01/27/19, Results: no abnormalities, repeat in 1 yr.  There is a FH of breast cancer in her mat great aunt, genetic testing not indicated. There is no FH of ovarian cancer. The patient does do self-breast exams.  Tobacco use: The patient denies current or previous tobacco use. Alcohol use: social drinker No drug use.  Exercise: moderately active  She does get adequate calcium but not Vitamin D in her diet.  Yearly labs through work.   Past Medical History:  Diagnosis Date  . Anemia   . Anxiety   . Major depression     Past Surgical History:  Procedure Laterality Date  . APPENDECTOMY  2005  . CHOLECYSTECTOMY  2005   Dr. Jamal Collin  . GASTRIC BYPASS    . KNEE ARTHROSCOPY W/ ACL RECONSTRUCTION Right 07/13/2015   Dr. Para March (miniscus)    Family History  Problem Relation Age of Onset  . Asthma Mother   . Lung cancer Maternal Grandmother   . Colon cancer Maternal Aunt 60       stage 4, mtes to lung and liver  . Breast cancer Other     Social History   Socioeconomic History  . Marital status: Married    Spouse name: Not on file  . Number of children: Not on file  . Years of education: Not on  file  . Highest education level: Not on file  Occupational History  . Not on file  Social Needs  . Financial resource strain: Not on file  . Food insecurity:    Worry: Not on file    Inability: Not on file  . Transportation needs:    Medical: Not on file    Non-medical: Not on file  Tobacco Use  . Smoking status: Never Smoker  . Smokeless tobacco: Never Used  Substance and Sexual Activity  . Alcohol use: Yes    Comment: occasional  . Drug use: No  . Sexual activity: Yes    Birth control/protection: I.U.D.    Comment: Mirena  Lifestyle  . Physical activity:    Days per week: Not on file    Minutes per session: Not on file  . Stress: Not on file  Relationships  . Social connections:    Talks on phone: Not on file    Gets together: Not on file    Attends religious service: Not on file    Active member of club or organization: Not on file    Attends meetings of clubs or organizations: Not on file    Relationship status: Not on file  . Intimate partner violence:    Fear of current or  ex partner: Not on file    Emotionally abused: Not on file    Physically abused: Not on file    Forced sexual activity: Not on file  Other Topics Concern  . Not on file  Social History Narrative  . Not on file    Current Meds  Medication Sig  . ALPRAZolam (XANAX) 0.5 MG tablet TAKE 1 TABLET BY MOUTH TWICE A DAY AS NEEDED FOR ANXIETY  . diclofenac (VOLTAREN) 50 MG EC tablet TAKE 1 TABLET BY MOUTH TWICE A DAY WITH FOOD AS NEEDED FOR PAIN AND SWELLING  . ferrous gluconate (FERGON) 240 (27 FE) MG tablet TAKE 1 TABLET BY MOUTH 2 TIMES A DAY  . levonorgestrel (MIRENA, 52 MG,) 20 MCG/24HR IUD by Intrauterine route once for 1 dose.  . Multiple Vitamin (MULTIVITAMINS PO) Take by mouth.  . traZODone (DESYREL) 50 MG tablet TAKE 1 TABLET BY MOUTH EVERYDAY AT BEDTIME  . [DISCONTINUED] levonorgestrel (MIRENA, 52 MG,) 20 MCG/24HR IUD by Intrauterine route once.      ROS:  Review of Systems   Constitutional: Negative for fatigue, fever and unexpected weight change.  Respiratory: Negative for cough, shortness of breath and wheezing.   Cardiovascular: Negative for chest pain, palpitations and leg swelling.  Gastrointestinal: Negative for blood in stool, constipation, diarrhea, nausea and vomiting.  Endocrine: Negative for cold intolerance, heat intolerance and polyuria.  Genitourinary: Positive for dyspareunia. Negative for dysuria, flank pain, frequency, genital sores, hematuria, menstrual problem, pelvic pain, urgency, vaginal bleeding, vaginal discharge and vaginal pain.  Musculoskeletal: Negative for back pain, joint swelling and myalgias.  Skin: Negative for rash.  Neurological: Negative for dizziness, syncope, light-headedness, numbness and headaches.  Hematological: Negative for adenopathy.  Psychiatric/Behavioral: Negative for agitation, confusion, sleep disturbance and suicidal ideas. The patient is not nervous/anxious.      Objective: BP 120/90   Ht 5\' 3"  (1.6 m)   Wt 240 lb 12.8 oz (109.2 kg)   BMI 42.66 kg/m    Physical Exam Constitutional:      Appearance: She is well-developed.  Genitourinary:     Vulva, vagina, right adnexa and left adnexa normal.     No vulval lesion or tenderness noted.     No vaginal discharge, erythema or tenderness.     No cervical motion tenderness or polyp.     IUD strings visualized.     Uterus is tender.     Uterus is not enlarged.     No right or left adnexal mass present.     Right adnexa not tender.     Left adnexa not tender.  Neck:     Musculoskeletal: Normal range of motion.     Thyroid: No thyromegaly.  Cardiovascular:     Rate and Rhythm: Normal rate and regular rhythm.     Heart sounds: Normal heart sounds. No murmur.  Pulmonary:     Effort: Pulmonary effort is normal.     Breath sounds: Normal breath sounds.  Chest:     Breasts:        Right: No mass, nipple discharge, skin change or tenderness.        Left:  No mass, nipple discharge, skin change or tenderness.  Abdominal:     Palpations: Abdomen is soft.     Tenderness: There is no abdominal tenderness. There is no guarding.  Musculoskeletal: Normal range of motion.  Neurological:     General: No focal deficit present.     Mental Status: She is alert and oriented  to person, place, and time.     Cranial Nerves: No cranial nerve deficit.  Skin:    General: Skin is warm and dry.  Psychiatric:        Mood and Affect: Mood normal.        Behavior: Behavior normal.        Thought Content: Thought content normal.        Judgment: Judgment normal.  Vitals signs reviewed.    IUD Removal Strings of IUD identified and grasped.  IUD removed without problem with ring forceps.  Pt tolerated this well.  IUD noted to be intact.  IUD Insertion Procedure Note Patient identified, informed consent performed, consent signed.   Discussed risks of irregular bleeding, cramping, infection, malpositioning or misplacement of the IUD outside the uterus which may require further procedure such as laparoscopy, risk of failure <1%. Time out was performed.    Speculum placed in the vagina.  Cervix visualized.  Cleaned with Betadine x 2.  Grasped anteriorly with a single tooth tenaculum.  Uterus sounded to 7.0 cm.   IUD placed per manufacturer's recommendations.  Strings trimmed to 3 cm. Tenaculum was removed, good hemostasis noted.  Patient tolerated procedure well.    Assessment/Plan: Encounter for annual routine gynecological examination  Cervical cancer screening - Plan: Cytology - PAP  Screening for HPV (human papillomavirus) - Plan: Cytology - PAP  Cervical high risk human papillomavirus (HPV) DNA test positive - Plan: Cytology - PAP  Screening for breast cancer - Pt current on mammo - Plan: MM 3D SCREEN BREAST BILATERAL  Encounter for removal and reinsertion of intrauterine contraceptive device (IUD) - Mirena IUD replaced today. RTO in 4 wks for f/u. -  Plan: levonorgestrel (MIRENA, 52 MG,) 20 MCG/24HR IUD           Meds ordered this encounter  Medications  . levonorgestrel (MIRENA, 52 MG,) 20 MCG/24HR IUD    Sig: by Intrauterine route once for 1 dose.    Dispense:  1 each    Refill:  0     GYN counsel breast self exam, mammography screening, adequate intake of calcium and vitamin D     F/U  Return in about 4 weeks (around 05/18/2019) for IUD f/u.  Alicia B. Copland, PA-C 04/20/2019 10:03 AM

## 2019-04-20 ENCOUNTER — Other Ambulatory Visit (HOSPITAL_COMMUNITY)
Admission: RE | Admit: 2019-04-20 | Discharge: 2019-04-20 | Disposition: A | Discharge status: Home / Self Care | Payer: BLUE CROSS/BLUE SHIELD | Source: Ambulatory Visit | Attending: Obstetrics and Gynecology | Admitting: Obstetrics and Gynecology

## 2019-04-20 ENCOUNTER — Ambulatory Visit (INDEPENDENT_AMBULATORY_CARE_PROVIDER_SITE_OTHER): Payer: BLUE CROSS/BLUE SHIELD | Admitting: Obstetrics and Gynecology

## 2019-04-20 ENCOUNTER — Encounter: Payer: Self-pay | Admitting: Obstetrics and Gynecology

## 2019-04-20 ENCOUNTER — Other Ambulatory Visit: Payer: Self-pay

## 2019-04-20 VITALS — BP 120/90 | Ht 63.0 in | Wt 240.8 lb

## 2019-04-20 DIAGNOSIS — R87810 Cervical high risk human papillomavirus (HPV) DNA test positive: Secondary | ICD-10-CM | POA: Insufficient documentation

## 2019-04-20 DIAGNOSIS — Z01419 Encounter for gynecological examination (general) (routine) without abnormal findings: Secondary | ICD-10-CM | POA: Diagnosis not present

## 2019-04-20 DIAGNOSIS — Z1239 Encounter for other screening for malignant neoplasm of breast: Secondary | ICD-10-CM

## 2019-04-20 DIAGNOSIS — Z124 Encounter for screening for malignant neoplasm of cervix: Secondary | ICD-10-CM

## 2019-04-20 DIAGNOSIS — Z1151 Encounter for screening for human papillomavirus (HPV): Secondary | ICD-10-CM

## 2019-04-20 DIAGNOSIS — Z30433 Encounter for removal and reinsertion of intrauterine contraceptive device: Secondary | ICD-10-CM | POA: Diagnosis not present

## 2019-04-20 MED ORDER — LEVONORGESTREL 20 MCG/24HR IU IUD: INTRAUTERINE_SYSTEM | Freq: Once | INTRAUTERINE | 0 refills | Status: AC

## 2019-04-20 NOTE — Patient Instructions (Signed)
I value your feedback and entrusting us with your care. If you get a [facility] patient survey, I would appreciate you taking the time to let us know about your experience today. Thank you!  [facility] [phone]  Instructions after IUD insertion  Most women experience no significant problems after insertion of an IUD, however minor cramping and spotting for a few days is common. Cramps may be treated with ibuprofen 800mg every 8 hours or Tylenol 650 mg every 4 hours. Contact [facility] immediately if you experience any of the following symptoms during the next week: temperature >99.6 degrees, worsening pelvic pain, abdominal pain, fainting, unusually heavy vaginal bleeding, foul vaginal discharge, or if you think you have expelled the IUD.  Nothing inserted in the vagina for 48 hours. You will be scheduled for a follow up visit in approximately four weeks.  You should check monthly to be sure you can feel the IUD strings in the upper vagina. If you are having a monthly period, try to check after each period. If you cannot feel the IUD strings,  contact [facility] immediately so we can do an exam to determine if the IUD has been expelled.   Please use backup protection until we can confirm the IUD is in place.  Call [facility] if you are exposed to or diagnosed with a sexually transmitted infection, as we will need to discuss whether it is safe for you to continue using an IUD.   

## 2019-04-21 LAB — CYTOLOGY - PAP
Diagnosis: NEGATIVE
HPV: NOT DETECTED

## 2019-05-18 NOTE — Progress Notes (Signed)
   Chief Complaint  Patient presents with  . Contraception    IUD check     History of Present Illness:  Carrie Stokes is a 43 y.o. that had a Mirena IUD REplaced approximately 1 month ago. Since that time, she denies dyspareunia, pelvic pain, non-menstrual bleeding, vaginal d/c, heavy bleeding. She is having cystic acne and remembers this happening in the past with IUD replacement. Has had to have I&D with derm in past. Last time they put her on doxy 100 mg BID for 2-3 months as preventive and that worked well. Pt would like refill if possible. Sx seem to calm down after a few months.   Review of Systems  Constitutional: Negative for fever.  Gastrointestinal: Negative for blood in stool, constipation, diarrhea, nausea and vomiting.  Genitourinary: Negative for dyspareunia, dysuria, flank pain, frequency, hematuria, urgency, vaginal bleeding, vaginal discharge and vaginal pain.  Musculoskeletal: Negative for back pain.  Skin: Negative for rash.    Physical Exam:  BP (!) 144/90   Ht 5\' 3"  (1.6 m)   Wt 241 lb 6.4 oz (109.5 kg)   BMI 42.76 kg/m  Body mass index is 42.76 kg/m.  Pelvic exam:  Two IUD strings present seen coming from the cervical os. EGBUS, vaginal vault and cervix: within normal limits   Assessment:   Encounter for routine checking of intrauterine contraceptive device (IUD) - Plan: IUD in place, pt doing well. F/u prn.   Cystic acne - Plan: doxycycline (VIBRAMYCIN) 100 MG capsule, Rx doxy eRxd. F/u with derm prn.   Plan: F/u if any signs of infection or can no longer feel the strings.   Tasia Liz B. Yina Riviere, PA-C 05/19/2019 2:27 PM

## 2019-05-19 ENCOUNTER — Encounter: Payer: Self-pay | Admitting: Obstetrics and Gynecology

## 2019-05-19 ENCOUNTER — Other Ambulatory Visit: Payer: Self-pay

## 2019-05-19 ENCOUNTER — Ambulatory Visit (INDEPENDENT_AMBULATORY_CARE_PROVIDER_SITE_OTHER): Payer: BC Managed Care – PPO | Admitting: Obstetrics and Gynecology

## 2019-05-19 VITALS — BP 144/90 | Ht 63.0 in | Wt 241.4 lb

## 2019-05-19 DIAGNOSIS — L700 Acne vulgaris: Secondary | ICD-10-CM | POA: Diagnosis not present

## 2019-05-19 DIAGNOSIS — Z30431 Encounter for routine checking of intrauterine contraceptive device: Secondary | ICD-10-CM | POA: Diagnosis not present

## 2019-05-19 MED ORDER — DOXYCYCLINE HYCLATE 100 MG PO CAPS: 100.0000 mg | ORAL_CAPSULE | Freq: Two times a day (BID) | ORAL | 2 refills | Status: DC

## 2019-05-19 NOTE — Patient Instructions (Signed)
I value your feedback and entrusting us with your care. If you get a [facility] patient survey, I would appreciate you taking the time to let us know about your experience today. Thank you! 

## 2019-05-27 ENCOUNTER — Other Ambulatory Visit: Payer: Self-pay | Admitting: Physician Assistant

## 2019-05-27 DIAGNOSIS — F411 Generalized anxiety disorder: Secondary | ICD-10-CM

## 2019-06-22 DIAGNOSIS — S82839A Other fracture of upper and lower end of unspecified fibula, initial encounter for closed fracture: Secondary | ICD-10-CM | POA: Insufficient documentation

## 2019-06-22 DIAGNOSIS — S82821A Torus fracture of lower end of right fibula, initial encounter for closed fracture: Secondary | ICD-10-CM | POA: Diagnosis not present

## 2019-06-23 ENCOUNTER — Other Ambulatory Visit: Payer: Self-pay | Admitting: Physician Assistant

## 2019-06-23 DIAGNOSIS — F5101 Primary insomnia: Secondary | ICD-10-CM

## 2019-06-25 DIAGNOSIS — S82821A Torus fracture of lower end of right fibula, initial encounter for closed fracture: Secondary | ICD-10-CM | POA: Diagnosis not present

## 2019-07-20 DIAGNOSIS — S82821A Torus fracture of lower end of right fibula, initial encounter for closed fracture: Secondary | ICD-10-CM | POA: Diagnosis not present

## 2019-08-14 DIAGNOSIS — S82821D Torus fracture of lower end of right fibula, subsequent encounter for fracture with routine healing: Secondary | ICD-10-CM | POA: Diagnosis not present

## 2019-10-21 ENCOUNTER — Ambulatory Visit
Admission: EM | Admit: 2019-10-21 | Discharge: 2019-10-21 | Disposition: A | Discharge status: Home / Self Care | Payer: BC Managed Care – PPO

## 2019-10-21 ENCOUNTER — Other Ambulatory Visit: Payer: Self-pay

## 2019-10-21 DIAGNOSIS — Z20822 Contact with and (suspected) exposure to covid-19: Secondary | ICD-10-CM

## 2019-10-23 LAB — NOVEL CORONAVIRUS, NAA: SARS-CoV-2, NAA: DETECTED — AB

## 2019-10-24 ENCOUNTER — Encounter: Payer: Self-pay | Admitting: Physician Assistant

## 2019-11-27 ENCOUNTER — Other Ambulatory Visit: Payer: Self-pay | Admitting: Physician Assistant

## 2019-11-27 DIAGNOSIS — F411 Generalized anxiety disorder: Secondary | ICD-10-CM

## 2019-11-27 NOTE — Telephone Encounter (Signed)
  Notes to clinic:  Not delegated    Requested Prescriptions  Pending Prescriptions Disp Refills   ALPRAZolam (XANAX) 0.5 MG tablet [Pharmacy Med Name: ALPRAZOLAM 0.5 MG TABLET] 60 tablet     Sig: TAKE 1 TABLET BY MOUTH TWICE A DAY AS NEEDED FOR ANXIETY      Not Delegated - Psychiatry:  Anxiolytics/Hypnotics Failed - 11/27/2019  9:11 AM      Failed - This refill cannot be delegated      Failed - Urine Drug Screen completed in last 360 days.      Failed - Valid encounter within last 6 months    Recent Outpatient Visits           1 year ago Yeast vaginitis   [place], [place], [place]   2 years ago Class 3 obesity due to excess calories with serious comorbidity and body mass index (BMI) of 40.0 to 44.9 in adult [facility])   [place], [name], [place]   2 years ago Anxiety, generalized   [place], [place], [place]   3 years ago Carbuncle of arm, left   [place], [place]   4 years ago [place] Margarita Rana, MD

## 2019-12-23 ENCOUNTER — Other Ambulatory Visit: Payer: Self-pay | Admitting: Physician Assistant

## 2019-12-23 DIAGNOSIS — F5101 Primary insomnia: Secondary | ICD-10-CM

## 2019-12-23 NOTE — Telephone Encounter (Signed)
Requested medication (s) are due for refill today: no  Requested medication (s) are on the active medication list: yes  Last refill:  09/23/2019  Future visit scheduled: no  Notes to clinic:  no valid encounter within last 6 months   Requested Prescriptions  Pending Prescriptions Disp Refills   traZODone (DESYREL) 50 MG tablet [Pharmacy Med Name: TRAZODONE 50 MG TABLET] 90 tablet 1    Sig: TAKE 1 TABLET BY MOUTH EVERYDAY AT BEDTIME      Psychiatry: Antidepressants - Serotonin Modulator Failed - 12/23/2019  1:13 AM      Failed - Completed PHQ-2 or PHQ-9 in the last 360 days.      Failed - Valid encounter within last 6 months    Recent Outpatient Visits           1 year ago Yeast vaginitis   [place], [place], [place]   2 years ago Class 3 obesity due to excess calories with serious comorbidity and body mass index (BMI) of 40.0 to 44.9 in adult [facility])   [place], [name], [place]   3 years ago Anxiety, generalized   [place], [place], [place]   3 years ago Carbuncle of arm, left   [place], [place]   4 years ago [place] Margarita Rana, MD

## 2020-02-11 ENCOUNTER — Ambulatory Visit
Admission: RE | Admit: 2020-02-11 | Discharge: 2020-02-11 | Disposition: A | Discharge status: Home / Self Care | Payer: BC Managed Care – PPO | Source: Ambulatory Visit | Attending: Obstetrics and Gynecology | Admitting: Obstetrics and Gynecology

## 2020-02-11 DIAGNOSIS — Z1231 Encounter for screening mammogram for malignant neoplasm of breast: Secondary | ICD-10-CM | POA: Insufficient documentation

## 2020-02-11 DIAGNOSIS — Z1239 Encounter for other screening for malignant neoplasm of breast: Secondary | ICD-10-CM

## 2020-02-12 ENCOUNTER — Encounter: Payer: Self-pay | Admitting: Obstetrics and Gynecology

## 2020-03-16 ENCOUNTER — Other Ambulatory Visit: Payer: Self-pay | Admitting: Physician Assistant

## 2020-03-16 DIAGNOSIS — F5101 Primary insomnia: Secondary | ICD-10-CM

## 2020-03-16 NOTE — Telephone Encounter (Signed)
Requested medications are due for refill today?  Yes  Requested medications are on active medication list?  Yes  Last Refill:  12/23/2019   # 90 with no refills with notation indicating patient needs to schedule an office visit.    Future visit scheduled? No  Notes to Clinic:  Medication failed RX refill protocol due to no valid encounter in the last 6 months.  Last visit was 1 year ago.

## 2020-05-26 ENCOUNTER — Other Ambulatory Visit: Payer: Self-pay | Admitting: Physician Assistant

## 2020-05-26 DIAGNOSIS — F411 Generalized anxiety disorder: Secondary | ICD-10-CM

## 2020-06-08 ENCOUNTER — Other Ambulatory Visit: Payer: Self-pay | Admitting: Physician Assistant

## 2020-06-08 DIAGNOSIS — F5101 Primary insomnia: Secondary | ICD-10-CM

## 2020-06-08 NOTE — Telephone Encounter (Signed)
Requested medications are due for refill today?  Yes  Requested medications are on active medication list?  Yes  Last Refill:   03/16/2020  # 90 with no refills - with reminder to make an appointment for any future refills.    Future visit scheduled?  No   Notes to Clinic:  Medication failed RX refill protocol due to no valid encounter in the last 6 months.  Patient's last office visit was 06/18/2018.

## 2020-06-23 ENCOUNTER — Encounter: Payer: Self-pay | Admitting: Physician Assistant

## 2020-08-25 ENCOUNTER — Telehealth (INDEPENDENT_AMBULATORY_CARE_PROVIDER_SITE_OTHER): Payer: BC Managed Care – PPO | Admitting: Physician Assistant

## 2020-08-25 DIAGNOSIS — R059 Cough, unspecified: Secondary | ICD-10-CM | POA: Diagnosis not present

## 2020-08-25 NOTE — Progress Notes (Signed)
MyChart Video Visit    Virtual Visit via Video Note   This visit type was conducted due to national recommendations for restrictions regarding the COVID-19 Pandemic (e.g. social distancing) in an effort to limit this patient's exposure and mitigate transmission in our community. This patient is at least at moderate risk for complications without adequate follow up. This format is felt to be most appropriate for this patient at this time. Physical exam was limited by quality of the video and audio technology used for the visit.   Patient location: Home Provider location: Office   I discussed the limitations of evaluation and management by telemedicine and the availability of in person appointments. The patient expressed understanding and agreed to proceed.  Patient: Carrie Stokes   DOB: Nov 29, 1975   44 y.o. Female  MRN: [id number] Visit Date: 08/25/2020  Today's healthcare provider: Trinna Post, PA-C   Chief Complaint  Patient presents with  . Cough  . Nasal Congestion   Subjective    HPI   Patient presenting today with upper respiratory symptoms since Saturday. She is vaccinated against COVID, second shot on 06/30/2020.  She is having cough, congestion. Denies fevers, denies wheezing. She denies vomiting, myalgias. She reports a headache. She denies SOB. She has not been tested for COVID since symptoms started. She reports her niece is sick with similar symptoms.     Medications: Outpatient Medications Prior to Visit  Medication Sig  . ALPRAZolam (XANAX) 0.5 MG tablet TAKE 1 TABLET BY MOUTH TWICE A DAY AS NEEDED FOR ANXIETY  . Cholecalciferol (VITAMIN D3 PO) Take by mouth.  . diclofenac (VOLTAREN) 50 MG EC tablet TAKE 1 TABLET BY MOUTH TWICE A DAY WITH FOOD AS NEEDED FOR PAIN AND SWELLING  . doxycycline (VIBRAMYCIN) 100 MG capsule Take 1 capsule (100 mg total) by mouth 2 (two) times daily.  . ferrous gluconate (FERGON) 240 (27 FE) MG tablet TAKE 1 TABLET BY  MOUTH 2 TIMES A DAY  . levonorgestrel (MIRENA, 52 MG,) 20 MCG/24HR IUD by Intrauterine route once for 1 dose.  . Multiple Vitamin (MULTIVITAMINS PO) Take by mouth.  . traZODone (DESYREL) 50 MG tablet TAKE 1 TABLET BY MOUTH AT BEDTIME. PLEASE SCHEDULE OFFICE VISIT BEFORE ANY FUTURE REFILLS.   No facility-administered medications prior to visit.    Review of Systems    Objective    There were no vitals taken for this visit.   Physical Exam Constitutional:      Appearance: Normal appearance.  Pulmonary:     Effort: Pulmonary effort is normal. No respiratory distress.  Neurological:     Mental Status: She is alert.  Psychiatric:        Mood and Affect: Mood normal.        Behavior: Behavior normal.        Assessment & Plan    1. Cough  - symptoms and exam c/w viral URI  - no evidence of strep pharyngitis, CAP, AOM, bacterial sinusitis, or other bacterial infection - concern for possible COVID19 infection - will send for outpatient testing - discussed need to quarantine 10 days from start of symptoms and until fever-free for at least 48 hours - discussed need to quarantine household members - discussed symptomatic management with OTC tylenol, iburpofen, mucinex sudafed, flonase, 2nd gen antihistamine, natural course, and return precautions -discussed signs/symptoms of bacterial sinus infection, may call back 08/30/2020 if experiencing those for abx.   - Novel Coronavirus, NAA ([facility])    Return if  symptoms worsen or fail to improve.     I discussed the assessment and treatment plan with the patient. The patient was provided an opportunity to ask questions and all were answered. The patient agreed with the plan and demonstrated an understanding of the instructions.   The patient was advised to call back or seek an in-person evaluation if the symptoms worsen or if the condition fails to improve as anticipated.   ITrinna Post, PA-C, have reviewed all  documentation for this visit. The documentation on 08/25/20 for the exam, diagnosis, procedures, and orders are all accurate and complete.  The entirety of the information documented in the History of Present Illness, Review of Systems and Physical Exam were personally obtained by me. Portions of this information were initially documented by Wilburt Finlay, CMA and reviewed by me for thoroughness and accuracy.    Paulene Floor [facility] [phone] (phone) [phone] (fax)  [place]

## 2020-08-27 LAB — NOVEL CORONAVIRUS, NAA: SARS-CoV-2, NAA: NOT DETECTED

## 2020-08-27 LAB — SARS-COV-2, NAA 2 DAY TAT

## 2020-09-01 ENCOUNTER — Encounter: Payer: Self-pay | Admitting: Physician Assistant

## 2020-09-01 DIAGNOSIS — J329 Chronic sinusitis, unspecified: Secondary | ICD-10-CM

## 2020-09-01 DIAGNOSIS — B3731 Acute candidiasis of vulva and vagina: Secondary | ICD-10-CM

## 2020-09-01 DIAGNOSIS — B373 Candidiasis of vulva and vagina: Secondary | ICD-10-CM

## 2020-09-02 MED ORDER — AMOXICILLIN-POT CLAVULANATE 875-125 MG PO TABS: 1.0000 | ORAL_TABLET | Freq: Two times a day (BID) | ORAL | 0 refills | Status: DC

## 2020-09-05 MED ORDER — AMOXICILLIN-POT CLAVULANATE 875-125 MG PO TABS: 1.0000 | ORAL_TABLET | Freq: Two times a day (BID) | ORAL | 0 refills | Status: AC

## 2020-09-05 NOTE — Addendum Note (Signed)
Addended by: Trinna Post on: 09/05/2020 10:32 AM   Modules accepted: Orders

## 2020-09-20 MED ORDER — FLUCONAZOLE 150 MG PO TABS: ORAL_TABLET | ORAL | 0 refills | Status: DC

## 2020-09-20 NOTE — Addendum Note (Signed)
Addended by: Trinna Post on: 09/20/2020 12:08 PM   Modules accepted: Orders

## 2020-11-26 ENCOUNTER — Other Ambulatory Visit: Payer: Self-pay | Admitting: Physician Assistant

## 2020-11-26 DIAGNOSIS — F411 Generalized anxiety disorder: Secondary | ICD-10-CM

## 2020-11-26 NOTE — Telephone Encounter (Signed)
Requested medication (s) are due for refill today:yes  Requested medication (s) are on the active medication list: yes   Last refill: 05/26/20  #60   5 refills  Future visit scheduled no  Notes to clinic: not delegated  Requested Prescriptions  Pending Prescriptions Disp Refills   ALPRAZolam (XANAX) 0.5 MG tablet [Pharmacy Med Name: ALPRAZOLAM 0.5 MG TABLET] 60 tablet     Sig: TAKE 1 TABLET BY MOUTH TWICE A DAY AS NEEDED FOR ANXIETY      Not Delegated - Psychiatry:  Anxiolytics/Hypnotics Failed - 11/26/2020  1:38 PM      Failed - This refill cannot be delegated      Failed - Urine Drug Screen completed in last 360 days      Passed - Valid encounter within last 6 months    Recent Outpatient Visits           3 months ago Cough   [facility] Trinna Post, [place]   2 years ago Yeast vaginitis   [place], [place], [place]   3 years ago Class 3 obesity due to excess calories with serious comorbidity and body mass index (BMI) of 40.0 to 44.9 in adult [facility])   [place], [name], [place]   3 years ago Anxiety, generalized   [place], [place], [place]   4 years ago Carbuncle of arm, left   [place], [place], [place]

## 2020-11-28 NOTE — Telephone Encounter (Signed)
Pt needs refill on Xanax. Thanks!

## 2021-02-02 ENCOUNTER — Ambulatory Visit (INDEPENDENT_AMBULATORY_CARE_PROVIDER_SITE_OTHER): Payer: BC Managed Care – PPO | Admitting: Psychology

## 2021-02-02 DIAGNOSIS — F410 Panic disorder [episodic paroxysmal anxiety] without agoraphobia: Secondary | ICD-10-CM | POA: Diagnosis not present

## 2021-02-08 ENCOUNTER — Ambulatory Visit (INDEPENDENT_AMBULATORY_CARE_PROVIDER_SITE_OTHER): Payer: BC Managed Care – PPO | Admitting: Psychology

## 2021-02-08 DIAGNOSIS — F410 Panic disorder [episodic paroxysmal anxiety] without agoraphobia: Secondary | ICD-10-CM

## 2021-02-14 ENCOUNTER — Ambulatory Visit (INDEPENDENT_AMBULATORY_CARE_PROVIDER_SITE_OTHER): Payer: BC Managed Care – PPO | Admitting: Psychology

## 2021-02-14 DIAGNOSIS — F410 Panic disorder [episodic paroxysmal anxiety] without agoraphobia: Secondary | ICD-10-CM | POA: Diagnosis not present

## 2021-02-21 ENCOUNTER — Ambulatory Visit (INDEPENDENT_AMBULATORY_CARE_PROVIDER_SITE_OTHER): Payer: BC Managed Care – PPO | Admitting: Psychology

## 2021-02-21 DIAGNOSIS — F410 Panic disorder [episodic paroxysmal anxiety] without agoraphobia: Secondary | ICD-10-CM

## 2021-03-06 ENCOUNTER — Other Ambulatory Visit: Payer: Self-pay | Admitting: Obstetrics and Gynecology

## 2021-03-06 ENCOUNTER — Ambulatory Visit (INDEPENDENT_AMBULATORY_CARE_PROVIDER_SITE_OTHER): Payer: BC Managed Care – PPO | Admitting: Psychology

## 2021-03-06 DIAGNOSIS — F410 Panic disorder [episodic paroxysmal anxiety] without agoraphobia: Secondary | ICD-10-CM

## 2021-03-06 DIAGNOSIS — Z1231 Encounter for screening mammogram for malignant neoplasm of breast: Secondary | ICD-10-CM

## 2021-03-15 ENCOUNTER — Other Ambulatory Visit: Payer: Self-pay

## 2021-03-15 ENCOUNTER — Ambulatory Visit
Admission: RE | Admit: 2021-03-15 | Discharge: 2021-03-15 | Disposition: A | Discharge status: Home / Self Care | Payer: BC Managed Care – PPO | Source: Ambulatory Visit | Attending: Obstetrics and Gynecology | Admitting: Obstetrics and Gynecology

## 2021-03-15 DIAGNOSIS — Z1231 Encounter for screening mammogram for malignant neoplasm of breast: Secondary | ICD-10-CM | POA: Diagnosis not present

## 2021-03-20 ENCOUNTER — Ambulatory Visit (INDEPENDENT_AMBULATORY_CARE_PROVIDER_SITE_OTHER): Payer: BC Managed Care – PPO | Admitting: Psychology

## 2021-03-20 DIAGNOSIS — F410 Panic disorder [episodic paroxysmal anxiety] without agoraphobia: Secondary | ICD-10-CM

## 2021-03-28 ENCOUNTER — Ambulatory Visit (INDEPENDENT_AMBULATORY_CARE_PROVIDER_SITE_OTHER): Payer: BC Managed Care – PPO | Admitting: Psychology

## 2021-03-28 DIAGNOSIS — F410 Panic disorder [episodic paroxysmal anxiety] without agoraphobia: Secondary | ICD-10-CM | POA: Diagnosis not present

## 2021-04-03 ENCOUNTER — Ambulatory Visit: Payer: BC Managed Care – PPO | Admitting: Psychology

## 2021-04-14 ENCOUNTER — Ambulatory Visit (INDEPENDENT_AMBULATORY_CARE_PROVIDER_SITE_OTHER): Payer: BC Managed Care – PPO | Admitting: Psychology

## 2021-04-14 DIAGNOSIS — F410 Panic disorder [episodic paroxysmal anxiety] without agoraphobia: Secondary | ICD-10-CM

## 2021-04-26 ENCOUNTER — Ambulatory Visit (INDEPENDENT_AMBULATORY_CARE_PROVIDER_SITE_OTHER): Payer: BC Managed Care – PPO | Admitting: Psychology

## 2021-04-26 DIAGNOSIS — F410 Panic disorder [episodic paroxysmal anxiety] without agoraphobia: Secondary | ICD-10-CM

## 2021-05-05 ENCOUNTER — Ambulatory Visit: Payer: BC Managed Care – PPO | Admitting: Psychology

## 2021-05-18 ENCOUNTER — Other Ambulatory Visit: Payer: Self-pay

## 2021-05-18 DIAGNOSIS — Z20822 Contact with and (suspected) exposure to covid-19: Secondary | ICD-10-CM | POA: Diagnosis not present

## 2021-05-18 DIAGNOSIS — Z03818 Encounter for observation for suspected exposure to other biological agents ruled out: Secondary | ICD-10-CM | POA: Diagnosis not present

## 2021-05-18 DIAGNOSIS — U071 COVID-19: Secondary | ICD-10-CM

## 2021-05-18 NOTE — Telephone Encounter (Signed)
Patient reports that she did a home test yesterday and a PCR test today and it was both positive. She started having symptoms on Tues (2 days ago) she has cough, fever (up to 102), body aches, sore throat, and headache. Has taken Tylenol with minimal relief. She is requesting the antiviral to be sent into the pharmacy. [address] Please advise. Thanks!

## 2021-05-18 NOTE — Telephone Encounter (Signed)
Copied from [place] [id number]. Topic: General - Inquiry >> May 18, 2021 10:00 AM Greggory Keen D wrote: Reason for CRM: Pt called saying she is positive for Covid.  She wants to know if she can get the antiviral drug.  CB#  [phone]

## 2021-05-19 ENCOUNTER — Encounter: Payer: Self-pay | Admitting: Physician Assistant

## 2021-05-19 MED ORDER — NIRMATRELVIR/RITONAVIR (PAXLOVID)TABLET: 3.0000 | ORAL_TABLET | Freq: Two times a day (BID) | ORAL | 0 refills | Status: AC

## 2021-05-19 NOTE — Telephone Encounter (Signed)
Patient called in stated that she was waiting from yesterday 05/18/21 for a call back. Informed waiting on response from Dr B please advise

## 2021-05-19 NOTE — Telephone Encounter (Signed)
Ok to send in paxlovid (not renally dosed) last GFR >90.

## 2021-05-19 NOTE — Telephone Encounter (Signed)
Dr. B has sent this in. Advised patient.

## 2021-05-19 NOTE — Telephone Encounter (Signed)
When trying to send in paxlovid it was a high drug interaction with her IUD. Ok to send in?

## 2021-05-26 ENCOUNTER — Other Ambulatory Visit: Payer: Self-pay | Admitting: Physician Assistant

## 2021-05-26 DIAGNOSIS — F411 Generalized anxiety disorder: Secondary | ICD-10-CM

## 2021-05-31 ENCOUNTER — Encounter: Payer: Self-pay | Admitting: Physician Assistant

## 2021-06-01 NOTE — Telephone Encounter (Signed)
She has not been seen in >32m. Needs to schedule appt

## 2021-06-21 LAB — LIPID PANEL
Cholesterol: 164 (ref 0–200)
HDL: 77 — AB (ref 35–70)
LDL Cholesterol: 74
LDl/HDL Ratio: 2.1
Triglycerides: 70 (ref 40–160)

## 2021-06-21 LAB — BASIC METABOLIC PANEL
BUN: 5 (ref 4–21)
Chloride: 98 — AB (ref 99–108)
Glucose: 81
Potassium: 3.8 (ref 3.4–5.3)
Sodium: 137 (ref 137–147)

## 2021-06-21 LAB — HEPATIC FUNCTION PANEL
ALT: 35 (ref 7–35)
AST: 32 (ref 13–35)
Alkaline Phosphatase: 88 (ref 25–125)
Bilirubin, Total: 0.4

## 2021-06-21 LAB — CBC AND DIFFERENTIAL
HCT: 44 (ref 36–46)
Hemoglobin: 14.9 (ref 12.0–16.0)
Neutrophils Absolute: 2.3
Platelets: 210 (ref 150–399)
WBC: 4.6

## 2021-06-21 LAB — TSH: TSH: 4.03 (ref 0.41–5.90)

## 2021-06-21 LAB — CBC: RBC: 4.77 (ref 3.87–5.11)

## 2021-06-21 LAB — COMPREHENSIVE METABOLIC PANEL
Albumin: 4.3 (ref 3.5–5.0)
Calcium: 9.4 (ref 8.7–10.7)

## 2021-06-30 ENCOUNTER — Encounter: Payer: Self-pay | Admitting: Family Medicine

## 2021-07-13 ENCOUNTER — Other Ambulatory Visit: Payer: Self-pay

## 2021-07-13 ENCOUNTER — Ambulatory Visit
Admission: EM | Admit: 2021-07-13 | Discharge: 2021-07-13 | Disposition: A | Discharge status: Home / Self Care | Payer: BC Managed Care – PPO | Attending: Emergency Medicine | Admitting: Emergency Medicine

## 2021-07-13 DIAGNOSIS — T23272A Burn of second degree of left wrist, initial encounter: Secondary | ICD-10-CM

## 2021-07-13 MED ORDER — MUPIROCIN 2 % EX OINT: 1.0000 "application " | TOPICAL_OINTMENT | Freq: Two times a day (BID) | CUTANEOUS | 1 refills | Status: AC
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
## 2021-07-18 ENCOUNTER — Ambulatory Visit: Payer: Self-pay | Admitting: Obstetrics and Gynecology

## 2021-07-18 DIAGNOSIS — T23212A Burn of second degree of left thumb (nail), initial encounter: Secondary | ICD-10-CM | POA: Diagnosis not present

## 2021-07-18 DIAGNOSIS — T23202A Burn of second degree of left hand, unspecified site, initial encounter: Secondary | ICD-10-CM | POA: Diagnosis not present

## 2021-07-18 DIAGNOSIS — T310 Burns involving less than 10% of body surface: Secondary | ICD-10-CM | POA: Diagnosis not present

## 2021-07-18 DIAGNOSIS — X102XXD Contact with fats and cooking oils, subsequent encounter: Secondary | ICD-10-CM | POA: Diagnosis not present

## 2021-07-18 DIAGNOSIS — T23262A Burn of second degree of back of left hand, initial encounter: Secondary | ICD-10-CM | POA: Insufficient documentation

## 2021-07-18 DIAGNOSIS — Z6841 Body Mass Index (BMI) 40.0 and over, adult: Secondary | ICD-10-CM | POA: Diagnosis not present

## 2021-07-18 DIAGNOSIS — T23292D Burn of second degree of multiple sites of left wrist and hand, subsequent encounter: Secondary | ICD-10-CM | POA: Diagnosis not present

## 2021-07-27 DIAGNOSIS — T23262A Burn of second degree of back of left hand, initial encounter: Secondary | ICD-10-CM | POA: Diagnosis not present

## 2021-07-27 DIAGNOSIS — Z6841 Body Mass Index (BMI) 40.0 and over, adult: Secondary | ICD-10-CM | POA: Diagnosis not present

## 2021-07-27 DIAGNOSIS — T23212D Burn of second degree of left thumb (nail), subsequent encounter: Secondary | ICD-10-CM | POA: Diagnosis not present

## 2021-07-27 DIAGNOSIS — T23262D Burn of second degree of back of left hand, subsequent encounter: Secondary | ICD-10-CM | POA: Diagnosis not present

## 2021-07-27 DIAGNOSIS — X088XXA Exposure to other specified smoke, fire and flames, initial encounter: Secondary | ICD-10-CM | POA: Diagnosis not present

## 2021-07-27 DIAGNOSIS — X102XXA Contact with fats and cooking oils, initial encounter: Secondary | ICD-10-CM | POA: Diagnosis not present

## 2021-07-27 DIAGNOSIS — T23212A Burn of second degree of left thumb (nail), initial encounter: Secondary | ICD-10-CM | POA: Diagnosis not present

## 2021-08-15 ENCOUNTER — Encounter: Payer: Self-pay | Admitting: Family Medicine

## 2021-08-15 ENCOUNTER — Other Ambulatory Visit (INDEPENDENT_AMBULATORY_CARE_PROVIDER_SITE_OTHER): Payer: Self-pay

## 2021-08-15 ENCOUNTER — Ambulatory Visit: Payer: BC Managed Care – PPO | Admitting: Family Medicine

## 2021-08-15 ENCOUNTER — Other Ambulatory Visit: Payer: Self-pay

## 2021-08-15 VITALS — BP 131/66 | HR 90 | Temp 97.9°F | Ht 63.0 in | Wt 217.4 lb

## 2021-08-15 DIAGNOSIS — F411 Generalized anxiety disorder: Secondary | ICD-10-CM | POA: Diagnosis not present

## 2021-08-15 DIAGNOSIS — E669 Obesity, unspecified: Secondary | ICD-10-CM

## 2021-08-15 DIAGNOSIS — Z6838 Body mass index (BMI) 38.0-38.9, adult: Secondary | ICD-10-CM

## 2021-08-15 DIAGNOSIS — Z1211 Encounter for screening for malignant neoplasm of colon: Secondary | ICD-10-CM

## 2021-08-15 DIAGNOSIS — F5101 Primary insomnia: Secondary | ICD-10-CM | POA: Diagnosis not present

## 2021-08-15 MED ORDER — ALPRAZOLAM 0.5 MG PO TABS: 0.5000 mg | ORAL_TABLET | Freq: Two times a day (BID) | ORAL | 1 refills | Status: DC | PRN

## 2021-08-15 MED ORDER — PEG 3350-KCL-NA BICARB-NACL 420 G PO SOLR: 4000.0000 mL | Freq: Once | ORAL | 0 refills | Status: AC

## 2021-08-15 MED ORDER — OZEMPIC (0.25 OR 0.5 MG/DOSE) 2 MG/1.5ML ~~LOC~~ SOPN: PEN_INJECTOR | SUBCUTANEOUS | 0 refills | Status: DC

## 2021-08-15 MED ORDER — PAROXETINE HCL 20 MG PO TABS: 20.0000 mg | ORAL_TABLET | Freq: Every day | ORAL | 3 refills | Status: DC

## 2021-08-15 MED ORDER — TRAZODONE HCL 100 MG PO TABS: ORAL_TABLET | ORAL | 1 refills | Status: DC

## 2021-08-15 NOTE — Progress Notes (Signed)
Established patient visit   Patient: Carrie Stokes   DOB: 1976/06/21   45 y.o. Female  MRN: [id number] Visit Date: 08/15/2021  Today's healthcare provider: Lavon Paganini, MD   Chief Complaint  Patient presents with   Follow-up   Subjective    HPI  -Patient currently out of trazodone. Would lie to talk about another option if possible patient does not feel as if it helps her anymore.  Has been on it for several years. Helps her fall asleep but doesn't stay asleep (gets 5 hours per night of sleep)  -Would also like a refill on her xanax - tries to avoid taking with trazodone - takes for panic attacks. Recently separated from her husband who is an alcoholic.  States that she was taking 6-8 times weekly, not BID as Rx'd. Has been out since June. No withdrawal, but worsening panic attacks.  Took Zoloft in the past and didn't feel like it was helpful. Seeing a therapist.  -Has tried phentermine before (was helpful, but rebound and constipation) and wants to inquire about ozempic. Patient reports walking for 30 minutes at least 2-3xs weekly.  Consuming low carb diet and high protein.  Having difficulty losing weight.    -Not interested in flu vaccine today  -Colon cancer screening and Hepatitis screening due     Medications: Outpatient Medications Prior to Visit  Medication Sig   Cholecalciferol (VITAMIN D3 PO) Take by mouth.   diclofenac (VOLTAREN) 50 MG EC tablet TAKE 1 TABLET BY MOUTH TWICE A DAY WITH FOOD AS NEEDED FOR PAIN AND SWELLING   ferrous gluconate (FERGON) 240 (27 FE) MG tablet TAKE 1 TABLET BY MOUTH 2 TIMES A DAY   fluconazole (DIFLUCAN) 150 MG tablet Take one pill on day 1. Take 2nd pill three days later if still symptomatic.   [DISCONTINUED] ALPRAZolam (XANAX) 0.5 MG tablet TAKE 1 TABLET BY MOUTH TWICE A DAY AS NEEDED FOR ANXIETY   [DISCONTINUED] traZODone (DESYREL) 50 MG tablet TAKE 1 TABLET BY MOUTH AT BEDTIME. PLEASE SCHEDULE OFFICE VISIT BEFORE  ANY FUTURE REFILLS.   levonorgestrel (MIRENA, 52 MG,) 20 MCG/24HR IUD by Intrauterine route once for 1 dose.   Multiple Vitamin (MULTIVITAMINS PO) Take by mouth.   No facility-administered medications prior to visit.    Review of Systems - per HPI  Last CBC Lab Results  Component Value Date   WBC 4.6 06/21/2021   HGB 14.9 06/21/2021   HCT 44 06/21/2021   PLT 210 99/35/7017   Last metabolic panel Lab Results  Component Value Date   NA 137 06/21/2021   K 3.8 06/21/2021   CL 98 (A) 06/21/2021   BUN 5 06/21/2021   CREATININE 1.0 08/31/2015   CALCIUM 9.4 06/21/2021   ALBUMIN 4.3 06/21/2021   ALKPHOS 88 06/21/2021   AST 32 06/21/2021   ALT 35 06/21/2021   Last lipids Lab Results  Component Value Date   CHOL 164 06/21/2021   HDL 77 (A) 06/21/2021   LDLCALC 74 06/21/2021   TRIG 70 06/21/2021   Last hemoglobin A1c No results found for: HGBA1C Last thyroid functions Lab Results  Component Value Date   TSH 4.03 06/21/2021     Objective    BP 131/66   Pulse 90   Temp 97.9 F (36.6 C) (Oral)   Ht 5\' 3"  (1.6 m)   Wt 217 lb 6.4 oz (98.6 kg)   SpO2 99%   BMI 38.51 kg/m  BP Readings from Last 3  Encounters:  08/15/21 131/66  07/13/21 (!) 148/97  05/19/19 (!) 144/90   Wt Readings from Last 3 Encounters:  08/15/21 217 lb 6.4 oz (98.6 kg)  05/19/19 241 lb 6.4 oz (109.5 kg)  04/20/19 240 lb 12.8 oz (109.2 kg)      Physical Exam Vitals reviewed.  Constitutional:      General: She is not in acute distress.    Appearance: Normal appearance. She is well-developed. She is not diaphoretic.  HENT:     Head: Normocephalic and atraumatic.  Eyes:     General: No scleral icterus.    Conjunctiva/sclera: Conjunctivae normal.  Neck:     Thyroid: No thyromegaly.  Cardiovascular:     Rate and Rhythm: Normal rate and regular rhythm.     Pulses: Normal pulses.     Heart sounds: Normal heart sounds. No murmur heard. Pulmonary:     Effort: Pulmonary effort is normal. No  respiratory distress.     Breath sounds: Normal breath sounds. No wheezing, rhonchi or rales.  Musculoskeletal:     Cervical back: Neck supple.     Right lower leg: No edema.     Left lower leg: No edema.  Lymphadenopathy:     Cervical: No cervical adenopathy.  Skin:    General: Skin is warm and dry.     Findings: No rash.  Neurological:     Mental Status: She is alert and oriented to person, place, and time. Mental status is at baseline.  Psychiatric:        Mood and Affect: Mood normal.        Behavior: Behavior normal.      No results found for any visits on 08/15/21.  Assessment & Plan     Problem List Items Addressed This Visit       Other   Anxiety, generalized - Primary    Chronic and uncontrolled With panic attack symptoms She is going through a stressful time at this time Discussed using SSRI for better underlying management of her anxiety and only using benzos as needed for panic symptoms, but reducing these with SSRI Will Start Paxil 20 mg daily Discussed potential side effects, incl GI upset, sexual dysfunction, and SI Discussed that it can take 6-8 weeks to reach full efficacy Contracted for safety - no SI/HI Discussed synergistic effects of medications and therapy  Continue Phenadoz for now, but did decrease number of pills from 60 to 40/month Patient states she is not taking it every day Could consider further tapering future with new PCP      Relevant Medications   traZODone (DESYREL) 100 MG tablet   PARoxetine (PAXIL) 20 MG tablet   ALPRAZolam (XANAX) 0.5 MG tablet   Obesity    Longstanding Discussed healthy weight and wellness clinic Discussed mindful eating Discussed diet and exercise Discussed medication options We will try Ozempic, but discussed this may not get covered by insurance Titrate dose up follow-up visit if able to get Ozempic      Relevant Medications   Semaglutide,0.25 or 0.5MG /DOS, (OZEMPIC, 0.25 OR 0.5 MG/DOSE,) 2 MG/1.5ML  SOPN   Cannot sleep    Chronic and uncontrolled Increase trazodone 100 mg nightly as needed Avoid taking Xanax and trazodone together      Relevant Medications   traZODone (DESYREL) 100 MG tablet   Other Visit Diagnoses     Colon cancer screening       Relevant Orders   Ambulatory referral to Gastroenterology  Return in about 6 weeks (around 09/26/2021) for With new PCP, GAD f/u.      Total time spent on today's visit was greater than 40 minutes, including both face-to-face time and nonface-to-face time personally spent on review of chart (labs and imaging), discussing labs and goals, discussing further work-up, treatment options, referrals to specialist if needed, reviewing outside records of pertinent, answering patient's questions, and coordinating care.    I, Lavon Paganini, MD, have reviewed all documentation for this visit. The documentation on 08/15/21 for the exam, diagnosis, procedures, and orders are all accurate and complete.   Itai Barbian, Dionne Bucy, MD, MPH [facility]

## 2021-08-15 NOTE — Assessment & Plan Note (Signed)
Longstanding Discussed healthy weight and wellness clinic Discussed mindful eating Discussed diet and exercise Discussed medication options We will try Ozempic, but discussed this may not get covered by insurance Titrate dose up follow-up visit if able to get Ozempic

## 2021-08-15 NOTE — Progress Notes (Signed)
Gastroenterology Pre-Procedure Review  Request Date:  09/20/2021 Requesting Physician: Dr.  Bonna Gains  PATIENT REVIEW QUESTIONS: The patient responded to the following health history questions as indicated:    1. Are you having any GI issues? no 2. Do you have a personal history of Polyps? no 3. Do you have a family history of Colon Cancer or Polyps? yes (RECTAL CANCER) 4. Diabetes Mellitus? no 5. Joint replacements in the past 12 months?no 6. Major health problems in the past 3 months?no 7. Any artificial heart valves, MVP, or defibrillator?no    MEDICATIONS & ALLERGIES:    Patient reports the following regarding taking any anticoagulation/antiplatelet therapy:   Plavix, Coumadin, Eliquis, Xarelto, Lovenox, Pradaxa, Brilinta, or Effient? no Aspirin? no  Patient confirms/reports the following medications:  Current Outpatient Medications  Medication Sig Dispense Refill   ALPRAZolam (XANAX) 0.5 MG tablet Take 1 tablet (0.5 mg total) by mouth 2 (two) times daily as needed for anxiety. 40 tablet 1   Cholecalciferol (VITAMIN D3 PO) Take by mouth.     diclofenac (VOLTAREN) 50 MG EC tablet TAKE 1 TABLET BY MOUTH TWICE A DAY WITH FOOD AS NEEDED FOR PAIN AND SWELLING     ferrous gluconate (FERGON) 240 (27 FE) MG tablet TAKE 1 TABLET BY MOUTH 2 TIMES A DAY 100 tablet 5   fluconazole (DIFLUCAN) 150 MG tablet Take one pill on day 1. Take 2nd pill three days later if still symptomatic. 2 tablet 0   levonorgestrel (MIRENA, 52 MG,) 20 MCG/24HR IUD by Intrauterine route once for 1 dose. 1 each 0   Multiple Vitamin (MULTIVITAMINS PO) Take by mouth.     PARoxetine (PAXIL) 20 MG tablet Take 1 tablet (20 mg total) by mouth daily. 30 tablet 3   Semaglutide,0.25 or 0.5MG /DOS, (OZEMPIC, 0.25 OR 0.5 MG/DOSE,) 2 MG/1.5ML SOPN Inject 0.25 mg into the skin once a week for 14 days, THEN 0.5 mg once a week for 14 days. 1.5 mL 0   traZODone (DESYREL) 100 MG tablet TAKE 1 TABLET BY MOUTH AT BEDTIME PRN 30 tablet  1   No current facility-administered medications for this visit.    Patient confirms/reports the following allergies:  No Known Allergies  No orders of the defined types were placed in this encounter.   AUTHORIZATION INFORMATION Primary Insurance: 1D#: Group #:  Secondary Insurance: 1D#: Group #:  SCHEDULE INFORMATION: Date: 09/20/2021 Time: Location: [employer]

## 2021-08-15 NOTE — Assessment & Plan Note (Signed)
Chronic and uncontrolled With panic attack symptoms She is going through a stressful time at this time Discussed using SSRI for better underlying management of her anxiety and only using benzos as needed for panic symptoms, but reducing these with SSRI Will Start Paxil 20 mg daily Discussed potential side effects, incl GI upset, sexual dysfunction, and SI Discussed that it can take 6-8 weeks to reach full efficacy Contracted for safety - no SI/HI Discussed synergistic effects of medications and therapy  Continue Phenadoz for now, but did decrease number of pills from 60 to 40/month Patient states she is not taking it every day Could consider further tapering future with new PCP

## 2021-08-15 NOTE — Assessment & Plan Note (Signed)
Chronic and uncontrolled Increase trazodone 100 mg nightly as needed Avoid taking Xanax and trazodone together

## 2021-08-16 ENCOUNTER — Ambulatory Visit (INDEPENDENT_AMBULATORY_CARE_PROVIDER_SITE_OTHER): Payer: BC Managed Care – PPO | Admitting: Obstetrics and Gynecology

## 2021-08-16 ENCOUNTER — Encounter: Payer: Self-pay | Admitting: Obstetrics and Gynecology

## 2021-08-16 ENCOUNTER — Other Ambulatory Visit (HOSPITAL_COMMUNITY)
Admission: RE | Admit: 2021-08-16 | Discharge: 2021-08-16 | Disposition: A | Discharge status: Home / Self Care | Payer: BC Managed Care – PPO | Source: Ambulatory Visit | Attending: Obstetrics and Gynecology | Admitting: Obstetrics and Gynecology

## 2021-08-16 VITALS — BP 140/80 | Ht 63.0 in | Wt 216.0 lb

## 2021-08-16 DIAGNOSIS — N760 Acute vaginitis: Secondary | ICD-10-CM | POA: Diagnosis not present

## 2021-08-16 DIAGNOSIS — R102 Pelvic and perineal pain: Secondary | ICD-10-CM | POA: Diagnosis not present

## 2021-08-16 DIAGNOSIS — N930 Postcoital and contact bleeding: Secondary | ICD-10-CM

## 2021-08-16 DIAGNOSIS — B9689 Other specified bacterial agents as the cause of diseases classified elsewhere: Secondary | ICD-10-CM | POA: Insufficient documentation

## 2021-08-16 DIAGNOSIS — Z113 Encounter for screening for infections with a predominantly sexual mode of transmission: Secondary | ICD-10-CM | POA: Diagnosis not present

## 2021-08-16 DIAGNOSIS — M545 Low back pain, unspecified: Secondary | ICD-10-CM

## 2021-08-16 DIAGNOSIS — R399 Unspecified symptoms and signs involving the genitourinary system: Secondary | ICD-10-CM | POA: Diagnosis not present

## 2021-08-16 DIAGNOSIS — G8929 Other chronic pain: Secondary | ICD-10-CM

## 2021-08-16 LAB — POCT URINALYSIS DIPSTICK
Bilirubin, UA: NEGATIVE
Blood, UA: NEGATIVE
Glucose, UA: NEGATIVE
Ketones, UA: NEGATIVE
Leukocytes, UA: NEGATIVE
Nitrite, UA: NEGATIVE
Protein, UA: NEGATIVE
Spec Grav, UA: 1.01 (ref 1.010–1.025)
pH, UA: 6 (ref 5.0–8.0)

## 2021-08-16 LAB — POCT WET PREP WITH KOH
Clue Cells Wet Prep HPF POC: POSITIVE
KOH Prep POC: POSITIVE — AB
Trichomonas, UA: NEGATIVE
Yeast Wet Prep HPF POC: NEGATIVE

## 2021-08-16 MED ORDER — METRONIDAZOLE 500 MG PO TABS: 500.0000 mg | ORAL_TABLET | Freq: Two times a day (BID) | ORAL | 0 refills | Status: DC

## 2021-08-16 NOTE — Progress Notes (Signed)
Carrie Sprout, FNP   Chief Complaint  Patient presents with   Urinary Tract Infection    Burning urinating, lower back pain x 2-3 weeks   Vaginal Pain    Bleeding during intercourse on two occassions   Pelvic Pain    Throbbing x couple of weeks    HPI:      Carrie Stokes is a 45 y.o. G2P1011 whose LMP was No LMP recorded. (Menstrual status: IUD)., presents today for dysuria, urinary frequency with and without good flow, LBP and pelvic pain for a few wks; no hematuria/fevers. Hx of UTIs in past. Drinks a couple caffeine drinks daily. Also with vaginal irritation and occas fishy odor for the past few wks. No meds to treat. Has started probiotics and thinks that worsens sx. Hx of yeast vag in past, no BV. No GI sx.  She is sex active, has occas pelvic pain with sex recently. No new partners. Has had bleeding during sex twice in past few wks. Blood is red and stops after cleaning up. Has Mirena IUD, replaced 04/20/19. Pt is amenorrheic/no dysmen.   Past Medical History:  Diagnosis Date   Anemia    Anxiety    Major depression     Past Surgical History:  Procedure Laterality Date   APPENDECTOMY  2005   CHOLECYSTECTOMY  2005   Dr. Jamal Collin   GASTRIC BYPASS     KNEE ARTHROSCOPY W/ ACL RECONSTRUCTION Right 07/13/2015   Dr. Para March (miniscus)    Family History  Problem Relation Age of Onset   Asthma Mother    Colon cancer Maternal Aunt 60       stage 4, mtes to lung and liver   Lung cancer Maternal Aunt    Lung cancer Maternal Grandmother    Breast cancer Other     Social History   Socioeconomic History   Marital status: Married    Spouse name: Not on file   Number of children: Not on file   Years of education: Not on file   Highest education level: Not on file  Occupational History   Not on file  Tobacco Use   Smoking status: Never   Smokeless tobacco: Never  Vaping Use   Vaping Use: Never used  Substance and Sexual Activity   Alcohol use: Yes     Comment: occasional   Drug use: No   Sexual activity: Yes    Birth control/protection: I.U.D.    Comment: Mirena  Other Topics Concern   Not on file  Social History Narrative   Not on file   Social Determinants of Health   Financial Resource Strain: Not on file  Food Insecurity: Not on file  Transportation Needs: Not on file  Physical Activity: Not on file  Stress: Not on file  Social Connections: Not on file  Intimate Partner Violence: Not on file    Outpatient Medications Prior to Visit  Medication Sig Dispense Refill   ALPRAZolam (XANAX) 0.5 MG tablet Take 1 tablet (0.5 mg total) by mouth 2 (two) times daily as needed for anxiety. 40 tablet 1   Cholecalciferol (VITAMIN D3 PO) Take by mouth.     diclofenac (VOLTAREN) 50 MG EC tablet TAKE 1 TABLET BY MOUTH TWICE A DAY WITH FOOD AS NEEDED FOR PAIN AND SWELLING     ferrous gluconate (FERGON) 240 (27 FE) MG tablet TAKE 1 TABLET BY MOUTH 2 TIMES A DAY 100 tablet 5   Multiple Vitamin (MULTIVITAMINS PO) Take by  mouth.     PARoxetine (PAXIL) 20 MG tablet Take 1 tablet (20 mg total) by mouth daily. 30 tablet 3   polyethylene glycol-electrolytes (NULYTELY) 420 g solution Take by mouth.     silver sulfADIAZINE (SILVADENE) 1 % cream Apply topically daily.     traZODone (DESYREL) 100 MG tablet TAKE 1 TABLET BY MOUTH AT BEDTIME PRN 30 tablet 1   levonorgestrel (MIRENA, 52 MG,) 20 MCG/24HR IUD by Intrauterine route once for 1 dose. 1 each 0   fluconazole (DIFLUCAN) 150 MG tablet Take one pill on day 1. Take 2nd pill three days later if still symptomatic. 2 tablet 0   Semaglutide,0.25 or 0.5MG /DOS, (OZEMPIC, 0.25 OR 0.5 MG/DOSE,) 2 MG/1.5ML SOPN Inject 0.25 mg into the skin once a week for 14 days, THEN 0.5 mg once a week for 14 days. 1.5 mL 0   No facility-administered medications prior to visit.      ROS:  Review of Systems  Constitutional:  Negative for fever.  Gastrointestinal:  Negative for blood in stool, constipation, diarrhea,  nausea and vomiting.  Genitourinary:  Positive for dyspareunia, dysuria, frequency, pelvic pain, vaginal bleeding, vaginal discharge and vaginal pain. Negative for flank pain, hematuria and urgency.  Musculoskeletal:  Positive for back pain.  Skin:  Negative for rash.  BREAST: No symptoms   OBJECTIVE:   Vitals:  BP 140/80   Ht 5\' 3"  (1.6 m)   Wt 216 lb (98 kg)   BMI 38.26 kg/m   Physical Exam Vitals reviewed.  Constitutional:      Appearance: She is well-developed. She is not ill-appearing or toxic-appearing.  Pulmonary:     Effort: Pulmonary effort is normal.  Abdominal:     Tenderness: There is no abdominal tenderness. There is no right CVA tenderness, left CVA tenderness, guarding or rebound.  Genitourinary:    General: Normal vulva.     Pubic Area: No rash.      Labia:        Right: No rash, tenderness or lesion.        Left: No rash, tenderness or lesion.      Vagina: Normal. No vaginal discharge, erythema, tenderness or bleeding.     Cervix: No friability.     Uterus: Normal. Tender. Not enlarged.      Adnexa:        Right: Tenderness present. No mass.         Left: Tenderness present. No mass.       Comments: IUD STRINGS IN CX OS; NEG EXT VAG EXAM Musculoskeletal:        General: Normal range of motion.       Arms:     Cervical back: Normal range of motion.  Skin:    General: Skin is warm and dry.  Neurological:     General: No focal deficit present.     Mental Status: She is alert and oriented to person, place, and time.     Cranial Nerves: No cranial nerve deficit.  Psychiatric:        Mood and Affect: Mood normal.        Behavior: Behavior normal.        Thought Content: Thought content normal.        Judgment: Judgment normal.    Results: Results for orders placed or performed in visit on 08/16/21 (from the past 24 hour(s))  POCT Urinalysis Dipstick     Status: Normal   Collection Time: 08/16/21  3:12 PM  Result  Value Ref Range   Color, UA yellow     Clarity, UA clear    Glucose, UA Negative Negative   Bilirubin, UA neg    Ketones, UA neg    Spec Grav, UA 1.010 1.010 - 1.025   Blood, UA neg    pH, UA 6.0 5.0 - 8.0   Protein, UA Negative Negative   Urobilinogen, UA     Nitrite, UA neg    Leukocytes, UA Negative Negative   Appearance     Odor    POCT Wet Prep with KOH     Status: Abnormal   Collection Time: 08/16/21  3:12 PM  Result Value Ref Range   Trichomonas, UA Negative    Clue Cells Wet Prep HPF POC pos    Epithelial Wet Prep HPF POC     Yeast Wet Prep HPF POC neg    Bacteria Wet Prep HPF POC     RBC Wet Prep HPF POC     WBC Wet Prep HPF POC     KOH Prep POC Positive (A) Negative     Assessment/Plan: BV (bacterial vaginosis) - Plan: POCT Wet Prep with KOH, metroNIDAZOLE (FLAGYL) 500 MG tablet; pos sx and wet prep. Rx flagyl, no EtOH. Will RF if sx recur. F/u prn.   Pelvic pain - Plan: Cervicovaginal ancillary only; treat for BV, check STDs/C&S. Will check u/s if sx persist.  PCB (post coital bleeding) - Plan: Cervicovaginal ancillary only; neg exam. Rule out STDs. If neg, will treat with doxy and then do GYN u/s if sx persist.   Screening for STD (sexually transmitted disease) - Plan: Cervicovaginal ancillary only  UTI symptoms - Plan: POCT Urinalysis Dipstick, Urine Culture; neg UA, check C&S. Will f/u if pos. Meanwhile, d/c caffeine/increase water. F/u prn.   Chronic bilateral low back pain without sciatica--MSK on exam. LB stretch/heat/ice. F/u prn.    Meds ordered this encounter  Medications   metroNIDAZOLE (FLAGYL) 500 MG tablet    Sig: Take 1 tablet (500 mg total) by mouth 2 (two) times daily for 7 days.    Dispense:  14 tablet    Refill:  0    Order Specific Question:   Supervising Provider    Answer:   Gae Dry [[id number]]    Has annual 08/23/21   Return if symptoms worsen or fail to improve.  Isabela Nardelli B. Rickita Forstner, PA-C 08/16/2021 3:15 PM

## 2021-08-18 LAB — CERVICOVAGINAL ANCILLARY ONLY
Chlamydia: NEGATIVE
Comment: NEGATIVE
Comment: NORMAL
Neisseria Gonorrhea: NEGATIVE

## 2021-08-19 LAB — URINE CULTURE

## 2021-08-22 DIAGNOSIS — R87810 Cervical high risk human papillomavirus (HPV) DNA test positive: Secondary | ICD-10-CM | POA: Insufficient documentation

## 2021-08-22 NOTE — Progress Notes (Signed)
Carrie Sprout, FNP   Chief Complaint  Patient presents with   Gynecologic Exam    No concerns     HPI:      Carrie Stokes is a 45 y.o. No obstetric history on file. who LMP was No LMP recorded. (Menstrual status: IUD)., presents today for her annual examination.  Her menses are absent due to IUD, no dysmen.   Sex activity: single partner, contraception - IUD.  Mirena REplaced 04/20/19. She has had occas pelvic pain with sex recently. No new partners. Has had bleeding during sex twice in past few wks. Blood is red and stops after cleaning up. Seen for this 08/16/21 with neg STD testing. Hasn't been sex active again since eval. Treated for BV with flagyl with sx relief. SEE 08/16/21 NOTE  Last Pap: 04/20/19 Results were: no abnormalities /neg HPV DNA. She has a hx of abn paps, last colpo 1/17, last pos HPV 2018. Due for repeat pap today. Hx of STDs: HPV, HSV  Last mammogram: 03/15/21, Results: no abnormalities, repeat in 1 yr.  There is a FH of breast cancer in her mat great aunt, genetic testing not indicated. There is no FH of ovarian cancer. The patient does self-breast exams.  Tobacco use: The patient denies current or previous tobacco use. Alcohol use: none No drug use.  Exercise: moderately active  Colonoscopy: has colonoscopy sched for 11/22 with [facility]  She does get adequate calcium and Vitamin D in her diet.  Labs with PCP   Past Medical History:  Diagnosis Date   Anemia    Anxiety    Major depression     Past Surgical History:  Procedure Laterality Date   APPENDECTOMY  2005   CHOLECYSTECTOMY  2005   Dr. Jamal Collin   GASTRIC BYPASS     KNEE ARTHROSCOPY W/ ACL RECONSTRUCTION Right 07/13/2015   Dr. Para March (miniscus)    Family History  Problem Relation Age of Onset   Asthma Mother    Colon cancer Maternal Aunt 60       stage 4, mtes to lung and liver   Lung cancer Maternal Aunt    Lung cancer Maternal Grandmother    Breast cancer Other      Social History   Socioeconomic History   Marital status: Married    Spouse name: Not on file   Number of children: Not on file   Years of education: Not on file   Highest education level: Not on file  Occupational History   Not on file  Tobacco Use   Smoking status: Never   Smokeless tobacco: Never  Vaping Use   Vaping Use: Never used  Substance and Sexual Activity   Alcohol use: Yes    Comment: occasional   Drug use: No   Sexual activity: Yes    Birth control/protection: I.U.D.    Comment: Mirena  Other Topics Concern   Not on file  Social History Narrative   Not on file   Social Determinants of Health   Financial Resource Strain: Not on file  Food Insecurity: Not on file  Transportation Needs: Not on file  Physical Activity: Not on file  Stress: Not on file  Social Connections: Not on file  Intimate Partner Violence: Not on file    Current Meds  Medication Sig   ALPRAZolam (XANAX) 0.5 MG tablet Take 1 tablet (0.5 mg total) by mouth 2 (two) times daily as needed for anxiety.   Cholecalciferol (VITAMIN D3 PO)  Take by mouth.   diclofenac (VOLTAREN) 50 MG EC tablet TAKE 1 TABLET BY MOUTH TWICE A DAY WITH FOOD AS NEEDED FOR PAIN AND SWELLING   ferrous gluconate (FERGON) 240 (27 FE) MG tablet TAKE 1 TABLET BY MOUTH 2 TIMES A DAY   Multiple Vitamin (MULTIVITAMINS PO) Take by mouth.   PARoxetine (PAXIL) 20 MG tablet Take 1 tablet (20 mg total) by mouth daily.   polyethylene glycol-electrolytes (NULYTELY) 420 g solution Take by mouth.   silver sulfADIAZINE (SILVADENE) 1 % cream Apply topically daily.   traZODone (DESYREL) 100 MG tablet TAKE 1 TABLET BY MOUTH AT BEDTIME PRN     ROS:  Review of Systems  Constitutional:  Negative for fatigue, fever and unexpected weight change.  Respiratory:  Negative for cough, shortness of breath and wheezing.   Cardiovascular:  Negative for chest pain, palpitations and leg swelling.  Gastrointestinal:  Negative for blood in  stool, constipation, diarrhea, nausea and vomiting.  Endocrine: Negative for cold intolerance, heat intolerance and polyuria.  Genitourinary:  Negative for dyspareunia, dysuria, flank pain, frequency, genital sores, hematuria, menstrual problem, pelvic pain, urgency, vaginal bleeding, vaginal discharge and vaginal pain.  Musculoskeletal:  Negative for back pain, joint swelling and myalgias.  Skin:  Negative for rash.  Neurological:  Negative for dizziness, syncope, light-headedness, numbness and headaches.  Hematological:  Negative for adenopathy.  Psychiatric/Behavioral:  Negative for agitation, confusion, sleep disturbance and suicidal ideas. The patient is not nervous/anxious.     Objective: BP 128/80   Ht 5\' 3"  (1.6 m)   Wt 217 lb (98.4 kg)   BMI 38.44 kg/m    Physical Exam Constitutional:      Appearance: She is well-developed.  Genitourinary:     Vulva normal.     Right Labia: No rash, tenderness or lesions.    Left Labia: No tenderness, lesions or rash.    No vaginal discharge, erythema, tenderness or bleeding.      Right Adnexa: not tender and no mass present.    Left Adnexa: not tender and no mass present.    No cervical motion tenderness, friability or polyp.     IUD strings visualized.     Uterus is tender.     Uterus is not enlarged.  Breasts:    Right: No mass, nipple discharge, skin change or tenderness.     Left: No mass, nipple discharge, skin change or tenderness.  Neck:     Thyroid: No thyromegaly.  Cardiovascular:     Rate and Rhythm: Normal rate and regular rhythm.     Heart sounds: Normal heart sounds. No murmur heard. Pulmonary:     Effort: Pulmonary effort is normal.     Breath sounds: Normal breath sounds.  Abdominal:     Palpations: Abdomen is soft.     Tenderness: There is no abdominal tenderness. There is no guarding or rebound.  Musculoskeletal:        General: Normal range of motion.     Cervical back: Normal range of motion.   Lymphadenopathy:     Cervical: No cervical adenopathy.  Neurological:     General: No focal deficit present.     Mental Status: She is alert and oriented to person, place, and time.     Cranial Nerves: No cranial nerve deficit.  Skin:    General: Skin is warm and dry.  Psychiatric:        Mood and Affect: Mood normal.        Behavior: Behavior  normal.        Thought Content: Thought content normal.        Judgment: Judgment normal.  Vitals reviewed.    Assessment/Plan: Encounter for annual routine gynecological examination  Cervical cancer screening - Plan: Cytology - PAP  Screening for HPV (human papillomavirus) - Plan: Cytology - PAP  Cervical high risk human papillomavirus (HPV) DNA test positive - Plan: Cytology - PAP; repeat pap today  Encounter for routine checking of intrauterine contraceptive device (IUD)--IUD strings in cx os. Due for removal 6/28  Encounter for screening mammogram for malignant neoplasm of breast--pt current on mammo  BV (bacterial vaginosis)--sx resolved with flagyl. Will RF prn sx.   Pelvic pain--sx improved; hasn't been sex active yet. If sx persist, will check GYN u/s.   PCB (post coital bleeding)--hasn't been sex active since BV tx. Neg exam, no friability. Will treat with doxy and check GYN u/s if sx persist. Pt to f/u prn.              GYN counsel breast self exam, mammography screening, adequate intake of calcium and vitamin D     F/U  Return in about 1 year (around 03/0/1499) for annual.  Maryiah Olvey B. Seung Nidiffer, PA-C 08/23/2021 11:53 AM

## 2021-08-23 ENCOUNTER — Other Ambulatory Visit (HOSPITAL_COMMUNITY)
Admission: RE | Admit: 2021-08-23 | Discharge: 2021-08-23 | Disposition: A | Discharge status: Home / Self Care | Payer: BC Managed Care – PPO | Source: Ambulatory Visit | Attending: Obstetrics and Gynecology | Admitting: Obstetrics and Gynecology

## 2021-08-23 ENCOUNTER — Other Ambulatory Visit: Payer: Self-pay

## 2021-08-23 ENCOUNTER — Ambulatory Visit (INDEPENDENT_AMBULATORY_CARE_PROVIDER_SITE_OTHER): Payer: BC Managed Care – PPO | Admitting: Obstetrics and Gynecology

## 2021-08-23 ENCOUNTER — Encounter: Payer: Self-pay | Admitting: Obstetrics and Gynecology

## 2021-08-23 VITALS — BP 128/80 | Ht 63.0 in | Wt 217.0 lb

## 2021-08-23 DIAGNOSIS — R87810 Cervical high risk human papillomavirus (HPV) DNA test positive: Secondary | ICD-10-CM

## 2021-08-23 DIAGNOSIS — Z01419 Encounter for gynecological examination (general) (routine) without abnormal findings: Secondary | ICD-10-CM | POA: Diagnosis not present

## 2021-08-23 DIAGNOSIS — Z124 Encounter for screening for malignant neoplasm of cervix: Secondary | ICD-10-CM | POA: Diagnosis not present

## 2021-08-23 DIAGNOSIS — Z1151 Encounter for screening for human papillomavirus (HPV): Secondary | ICD-10-CM | POA: Insufficient documentation

## 2021-08-23 DIAGNOSIS — Z1231 Encounter for screening mammogram for malignant neoplasm of breast: Secondary | ICD-10-CM

## 2021-08-23 DIAGNOSIS — N930 Postcoital and contact bleeding: Secondary | ICD-10-CM

## 2021-08-23 DIAGNOSIS — Z30431 Encounter for routine checking of intrauterine contraceptive device: Secondary | ICD-10-CM

## 2021-08-23 DIAGNOSIS — N760 Acute vaginitis: Secondary | ICD-10-CM

## 2021-08-23 DIAGNOSIS — B9689 Other specified bacterial agents as the cause of diseases classified elsewhere: Secondary | ICD-10-CM

## 2021-08-23 DIAGNOSIS — R102 Pelvic and perineal pain: Secondary | ICD-10-CM

## 2021-08-23 NOTE — Patient Instructions (Signed)
I value your feedback and you entrusting us with your care. If you get a [facility] patient survey, I would appreciate you taking the time to let us know about your experience today. Thank you! ? ? ?

## 2021-08-25 LAB — CYTOLOGY - PAP
Comment: NEGATIVE
Diagnosis: NEGATIVE
Diagnosis: REACTIVE
High risk HPV: NEGATIVE

## 2021-08-28 DIAGNOSIS — S8002XA Contusion of left knee, initial encounter: Secondary | ICD-10-CM | POA: Diagnosis not present

## 2021-08-28 DIAGNOSIS — M7042 Prepatellar bursitis, left knee: Secondary | ICD-10-CM | POA: Diagnosis not present

## 2021-09-06 ENCOUNTER — Other Ambulatory Visit: Payer: Self-pay | Admitting: Family Medicine

## 2021-09-06 DIAGNOSIS — F5101 Primary insomnia: Secondary | ICD-10-CM

## 2021-09-06 NOTE — Telephone Encounter (Signed)
Requested Prescriptions  Pending Prescriptions Disp Refills  . traZODone (DESYREL) 100 MG tablet [Pharmacy Med Name: TRAZODONE 100 MG TABLET] 90 tablet 1    Sig: TAKE 1 TABLET BY MOUTH AT BEDTIME AS NEEDED     Psychiatry: Antidepressants - Serotonin Modulator Passed - 09/06/2021  2:32 PM      Passed - Completed PHQ-2 or PHQ-9 in the last 360 days      Passed - Valid encounter within last 6 months    Recent Outpatient Visits          3 weeks ago Anxiety, generalized   [facility] [place], Dionne Bucy, MD   1 year ago Cough   [facility], [place], [place]   3 years ago Yeast vaginitis   [place], [place], [place]   4 years ago Class 3 obesity due to excess calories with serious comorbidity and body mass index (BMI) of 40.0 to 44.9 in adult [facility])   [place], [name], [place]   4 years ago Anxiety, generalized   [facility] [place], [name], PA-C      Future Appointments            In 3 weeks [name], [place], [place]           . PARoxetine (PAXIL) 20 MG tablet [Pharmacy Med Name: PAROXETINE HCL 20 MG TABLET] 90 tablet 1    Sig: TAKE 1 TABLET BY MOUTH EVERY DAY     Psychiatry:  Antidepressants - SSRI Passed - 09/06/2021  2:32 PM      Passed - Completed PHQ-2 or PHQ-9 in the last 360 days      Passed - Valid encounter within last 6 months    Recent Outpatient Visits          3 weeks ago Anxiety, generalized   [place], Dionne Bucy, MD   1 year ago Cough   [place], [place], [place]   3 years ago Yeast vaginitis   [place], [place], [place]   4 years ago Class 3 obesity due to excess calories with serious comorbidity and body mass index (BMI) of 40.0 to 44.9 in adult [facility])   [place], [name], [place]   4 years ago Anxiety, generalized    [facility], [name], PA-C      Future Appointments            In 3 weeks [name], [place], [place]

## 2021-09-07 DIAGNOSIS — R600 Localized edema: Secondary | ICD-10-CM | POA: Diagnosis not present

## 2021-09-07 DIAGNOSIS — T23212D Burn of second degree of left thumb (nail), subsequent encounter: Secondary | ICD-10-CM | POA: Diagnosis not present

## 2021-09-07 DIAGNOSIS — X102XXS Contact with fats and cooking oils, sequela: Secondary | ICD-10-CM | POA: Diagnosis not present

## 2021-09-07 DIAGNOSIS — T23262S Burn of second degree of back of left hand, sequela: Secondary | ICD-10-CM | POA: Diagnosis not present

## 2021-09-07 DIAGNOSIS — T23262D Burn of second degree of back of left hand, subsequent encounter: Secondary | ICD-10-CM | POA: Diagnosis not present

## 2021-09-07 DIAGNOSIS — Z6839 Body mass index (BMI) 39.0-39.9, adult: Secondary | ICD-10-CM | POA: Diagnosis not present

## 2021-09-07 DIAGNOSIS — T23212S Burn of second degree of left thumb (nail), sequela: Secondary | ICD-10-CM | POA: Diagnosis not present

## 2021-09-20 ENCOUNTER — Encounter
Admission: RE | Disposition: A | Discharge status: Home / Self Care | Payer: Self-pay | Source: Home / Self Care | Attending: Gastroenterology

## 2021-09-20 ENCOUNTER — Ambulatory Visit: Payer: BC Managed Care – PPO | Admitting: Anesthesiology

## 2021-09-20 ENCOUNTER — Ambulatory Visit
Admission: RE | Admit: 2021-09-20 | Discharge: 2021-09-20 | Disposition: A | Discharge status: Home / Self Care | Payer: BC Managed Care – PPO | Attending: Gastroenterology | Admitting: Gastroenterology

## 2021-09-20 ENCOUNTER — Encounter: Payer: Self-pay | Admitting: Gastroenterology

## 2021-09-20 DIAGNOSIS — Z791 Long term (current) use of non-steroidal anti-inflammatories (NSAID): Secondary | ICD-10-CM | POA: Insufficient documentation

## 2021-09-20 DIAGNOSIS — Z975 Presence of (intrauterine) contraceptive device: Secondary | ICD-10-CM | POA: Diagnosis not present

## 2021-09-20 DIAGNOSIS — Z9049 Acquired absence of other specified parts of digestive tract: Secondary | ICD-10-CM | POA: Insufficient documentation

## 2021-09-20 DIAGNOSIS — Z79899 Other long term (current) drug therapy: Secondary | ICD-10-CM | POA: Diagnosis not present

## 2021-09-20 DIAGNOSIS — Z793 Long term (current) use of hormonal contraceptives: Secondary | ICD-10-CM | POA: Diagnosis not present

## 2021-09-20 DIAGNOSIS — Z1211 Encounter for screening for malignant neoplasm of colon: Secondary | ICD-10-CM

## 2021-09-20 DIAGNOSIS — Z9884 Bariatric surgery status: Secondary | ICD-10-CM | POA: Insufficient documentation

## 2021-09-20 HISTORY — PX: COLONOSCOPY WITH PROPOFOL: SHX5780

## 2021-09-20 LAB — POCT PREGNANCY, URINE: Preg Test, Ur: NEGATIVE

## 2021-09-20 SURGERY — COLONOSCOPY WITH PROPOFOL
Anesthesia: General

## 2021-09-20 MED ORDER — SODIUM CHLORIDE 0.9 % IV SOLN
INTRAVENOUS | Status: DC
  Administered 2021-09-20: 20 mL/h via INTRAVENOUS

## 2021-09-20 MED ORDER — PROPOFOL 500 MG/50ML IV EMUL
INTRAVENOUS | Status: DC | PRN
  Administered 2021-09-20: 125 ug/kg/min via INTRAVENOUS

## 2021-09-20 MED ORDER — PROPOFOL 10 MG/ML IV BOLUS
INTRAVENOUS | Status: DC | PRN
  Administered 2021-09-20: 80 mg via INTRAVENOUS

## 2021-09-20 MED ORDER — PROPOFOL 500 MG/50ML IV EMUL
INTRAVENOUS | Status: AC
  Filled 2021-09-20: qty 50

## 2021-09-20 MED ORDER — LIDOCAINE HCL (CARDIAC) PF 100 MG/5ML IV SOSY
PREFILLED_SYRINGE | INTRAVENOUS | Status: DC | PRN
  Administered 2021-09-20: 50 mg via INTRAVENOUS

## 2021-09-20 NOTE — Transfer of Care (Signed)
Immediate Anesthesia Transfer of Care Note  Patient: Carrie Stokes  Procedure(s) Performed: COLONOSCOPY WITH PROPOFOL  Patient Location: PACU  Anesthesia Type:MAC  Level of Consciousness: awake, alert  and oriented  Airway & Oxygen Therapy: Patient Spontanous Breathing and Patient connected to nasal cannula oxygen  Post-op Assessment: Report given to RN and Post -op Vital signs reviewed and stable  Post vital signs: stable  Last Vitals:  Vitals Value Taken Time  BP 117/77 09/20/21 0909  Temp    Pulse 67 09/20/21 0911  Resp    SpO2 100 % 09/20/21 0911  Vitals shown include unvalidated device data.  Last Pain:  Vitals:   09/20/21 0755  TempSrc: Temporal  PainSc: 0-No pain         Complications: No notable events documented.

## 2021-09-20 NOTE — H&P (Signed)
Carrie Antigua, MD [address], [place], [place], [place], [address] [address], [place], [place], [place], [address] Phone: [phone]  Fax: [phone]  Primary Care Physician:  [name], FNP   Pre-Procedure History & Physical: HPI:  Carrie Stokes is a 45 y.o. female is here for a colonoscopy.   Past Medical History:  Diagnosis Date   Anemia    Anxiety    Major depression     Past Surgical History:  Procedure Laterality Date   APPENDECTOMY  2005   CHOLECYSTECTOMY  2005   Dr. Jamal Collin   GASTRIC BYPASS     KNEE ARTHROSCOPY W/ ACL RECONSTRUCTION Right 07/13/2015   Dr. Para March (miniscus)    Prior to Admission medications   Medication Sig Start Date End Date Taking? Authorizing Provider  ALPRAZolam Duanne Moron) 0.5 MG tablet Take 1 tablet (0.5 mg total) by mouth 2 (two) times daily as needed for anxiety. 08/15/21  Yes Bacigalupo, Dionne Bucy, MD  Cholecalciferol (VITAMIN D3 PO) Take by mouth.   Yes [provider]  diclofenac (VOLTAREN) 50 MG EC tablet TAKE 1 TABLET BY MOUTH TWICE A DAY WITH FOOD AS NEEDED FOR PAIN AND SWELLING 03/30/19  Yes [provider]  ferrous gluconate (FERGON) 240 (27 FE) MG tablet TAKE 1 TABLET BY MOUTH 2 TIMES A DAY 10/26/15  Yes Margarita Rana, MD  Multiple Vitamin (MULTIVITAMINS PO) Take by mouth.   Yes [provider]  PARoxetine (PAXIL) 20 MG tablet TAKE 1 TABLET BY MOUTH EVERY DAY 09/06/21  Yes Tally Joe T, FNP  polyethylene glycol-electrolytes (NULYTELY) 420 g solution Take by mouth. 08/15/21  Yes [provider]  silver sulfADIAZINE (SILVADENE) 1 % cream Apply topically daily. 07/18/21  Yes [provider]  traZODone (DESYREL) 100 MG tablet TAKE 1 TABLET BY MOUTH AT BEDTIME AS NEEDED 09/06/21  Yes Tally Joe T, FNP  levonorgestrel (MIRENA, 52 MG,) 20 MCG/24HR IUD by Intrauterine route once for 1 dose. 04/20/19 01/24/[birth year]  Copland, Elmo Putt B, PA-C    Allergies as of 08/15/2021   (No Known  Allergies)    Family History  Problem Relation Age of Onset   Asthma Mother    Colon cancer Maternal Aunt 65       stage 4, mtes to lung and liver   Lung cancer Maternal Aunt    Lung cancer Maternal Grandmother    Breast cancer Other     Social History   Socioeconomic History   Marital status: Married    Spouse name: Not on file   Number of children: Not on file   Years of education: Not on file   Highest education level: Not on file  Occupational History   Not on file  Tobacco Use   Smoking status: Never   Smokeless tobacco: Never  Vaping Use   Vaping Use: Never used  Substance and Sexual Activity   Alcohol use: Yes    Comment: occasional   Drug use: No   Sexual activity: Yes    Birth control/protection: I.U.D.    Comment: Mirena  Other Topics Concern   Not on file  Social History Narrative   Not on file   Social Determinants of Health   Financial Resource Strain: Not on file  Food Insecurity: Not on file  Transportation Needs: Not on file  Physical Activity: Not on file  Stress: Not on file  Social Connections: Not on file  Intimate Partner Violence: Not on file    Review of Systems: See  HPI, otherwise negative ROS  Physical Exam: Constitutional: General:   Alert,  Well-developed, well-nourished, pleasant and cooperative in NAD BP (!) 137/99   Pulse 77   Temp (!) 96.9 F (36.1 C) (Temporal)   Resp 20   Ht 5\' 3"  (1.6 m)   Wt 94.3 kg   SpO2 99%   BMI 36.85 kg/m   Head: Normocephalic, atraumatic.   Eyes:  Sclera clear, no icterus.   Conjunctiva pink.   Mouth:  No deformity or lesions, oropharynx pink & moist.  Neck:  Supple, trachea midline  Respiratory: Normal respiratory effort  Gastrointestinal:  Soft, non-tender and non-distended without masses, hepatosplenomegaly or hernias noted.  No guarding or rebound tenderness.     Cardiac: No clubbing or edema.  No cyanosis. Normal posterior tibial pedal pulses noted.  Lymphatic:  No  significant cervical adenopathy.  Psych:  Alert and cooperative. Normal mood and affect.  Musculoskeletal:   Symmetrical without gross deformities. 5/5 Lower extremity strength bilaterally.  Skin: Warm. Intact without significant lesions or rashes. No jaundice.  Neurologic:  Face symmetrical, tongue midline, Normal sensation to touch;  grossly normal neurologically.  Psych:  Alert and oriented x3, Alert and cooperative. Normal mood and affect.  Impression/Plan: Carrie Stokes is here for a colonoscopy to be performed for average risk screening.  Risks, benefits, limitations, and alternatives regarding  colonoscopy have been reviewed with the patient.  Questions have been answered.  All parties agreeable.   Virgel Manifold, MD  09/20/2021, 8:37 AM

## 2021-09-20 NOTE — Op Note (Signed)
[facility] Patient Name: Carrie Stokes Procedure Date: 09/20/2021 7:59 AM MRN: [id number] Account #: [id number] Date of Birth: [birth year] Admit Type: Outpatient Age: 45 Room: [facility] ENDO ROOM 3 Gender: Female Note Status: Finalized Instrument Name: Jasper Riling [id number] Procedure:             Colonoscopy Indications:           Screening for colorectal malignant neoplasm Providers:             Rockell Faulks B. Bonna Gains MD, MD Medicines:             Monitored Anesthesia Care Complications:         No immediate complications. Procedure:             Pre-Anesthesia Assessment:                        - Prior to the procedure, a History and Physical was                         performed, and patient medications, allergies and                         sensitivities were reviewed. The patient's tolerance                         of previous anesthesia was reviewed.                        - The risks and benefits of the procedure and the                         sedation options and risks were discussed with the                         patient. All questions were answered and informed                         consent was obtained.                        - Patient identification and proposed procedure were                         verified prior to the procedure by the physician, the                         nurse, the anesthetist and the technician. The                         procedure was verified in the pre-procedure area in                         the procedure room in the endoscopy suite.                        - ASA Grade Assessment: II - A patient with mild                         systemic disease.                        -  After reviewing the risks and benefits, the patient                         was deemed in satisfactory condition to undergo the                         procedure.                        After obtaining informed consent, the colonoscope was                          passed under direct vision. Throughout the procedure,                         the patient's blood pressure, pulse, and oxygen                         saturations were monitored continuously. The                         Colonoscope was introduced through the anus and                         advanced to the the cecum, identified by appendiceal                         orifice and ileocecal valve. The colonoscopy was                         performed with ease. The patient tolerated the                         procedure well. The quality of the bowel preparation                         was good. Findings:      The perianal and digital rectal examinations were normal.      The rectum, sigmoid colon, descending colon, transverse colon, ascending       colon and cecum appeared normal.      The retroflexed view of the distal rectum and anal verge was normal and       showed no anal or rectal abnormalities. Impression:            - The rectum, sigmoid colon, descending colon,                         transverse colon, ascending colon and cecum are normal.                        - The distal rectum and anal verge are normal on                         retroflexion view.                        - No specimens collected. Recommendation:        - Discharge patient to home.                        -  Resume previous diet.                        - Continue present medications.                        - Repeat colonoscopy in 10 years for screening                         purposes.                        - Return to primary care physician as previously                         scheduled.                        - The findings and recommendations were discussed with                         the patient.                        - The findings and recommendations were discussed with                         the patient's family.                        - In the future, if patient develops new  symptoms such                         as blood per rectum, abdominal pain, weight loss,                         altered bowel habits or any other reason for concern,                         patient should discuss this with thier PCP as they may                         need a GI referral at that time or evaluation for need                         for colonoscopy earlier than the recommended screening                         colonoscopy.                        In addition, if patient's family history of colon                         cancer changes (no First degree relatives with history                         of colon cancer at this time) in the future, earlier  screening may be indicated and patient should discuss                         this with PCP as well. Procedure Code(s):     --- Professional ---                        [id number], Colonoscopy, flexible; diagnostic, including                         collection of specimen(s) by brushing or washing, when                         performed (separate procedure) Diagnosis Code(s):     --- Professional ---                        Z12.11, Encounter for screening for malignant neoplasm                         of colon CPT copyright 2019 American Medical Association. All rights reserved. The codes documented in this report are preliminary and upon coder review may  be revised to meet current compliance requirements.  Vonda Antigua, MD Margretta Sidle B. Bonna Gains MD, MD 09/20/2021 9:10:19 AM This report has been signed electronically. Number of Addenda: 0 Note Initiated On: 09/20/2021 7:59 AM Scope Withdrawal Time: 0 hours 15 minutes 49 seconds  Total Procedure Duration: 0 hours 19 minutes 5 seconds       [facility]

## 2021-09-20 NOTE — Anesthesia Preprocedure Evaluation (Signed)
Anesthesia Evaluation  Patient identified by MRN, date of birth, ID band Patient awake    Reviewed: Allergy & Precautions, H&P , NPO status , Patient's Chart, lab work & pertinent test results, reviewed documented beta blocker date and time   History of Anesthesia Complications Negative for: history of anesthetic complications  Airway Mallampati: I  TM Distance: >3 FB Neck ROM: full    Dental  (+) Dental Advidsory Given, Caps, Teeth Intact, Missing   Pulmonary neg pulmonary ROS,    Pulmonary exam normal breath sounds clear to auscultation       Cardiovascular Exercise Tolerance: Good negative cardio ROS Normal cardiovascular exam Rhythm:regular Rate:Normal     Neuro/Psych PSYCHIATRIC DISORDERS Anxiety Depression negative neurological ROS     GI/Hepatic Neg liver ROS, GERD  ,  Endo/Other  negative endocrine ROS  Renal/GU negative Renal ROS  negative genitourinary   Musculoskeletal   Abdominal   Peds  Hematology  (+) Blood dyscrasia, anemia ,   Anesthesia Other Findings Past Medical History: No date: Anemia No date: Anxiety No date: Major depression   Reproductive/Obstetrics negative OB ROS                             Anesthesia Physical Anesthesia Plan  ASA: 2  Anesthesia Plan: General   Post-op Pain Management:    Induction: Intravenous  PONV Risk Score and Plan: 3 and TIVA and Propofol infusion  Airway Management Planned: Natural Airway and Nasal Cannula  Additional Equipment:   Intra-op Plan:   Post-operative Plan:   Informed Consent: I have reviewed the patients History and Physical, chart, labs and discussed the procedure including the risks, benefits and alternatives for the proposed anesthesia with the patient or authorized representative who has indicated his/her understanding and acceptance.     Dental Advisory Given  Plan Discussed with: Anesthesiologist,  CRNA and Surgeon  Anesthesia Plan Comments:         Anesthesia Quick Evaluation

## 2021-09-21 ENCOUNTER — Encounter: Payer: Self-pay | Admitting: Gastroenterology

## 2021-09-22 NOTE — Anesthesia Postprocedure Evaluation (Signed)
Anesthesia Post Note  Patient: Contractor  Procedure(s) Performed: COLONOSCOPY WITH PROPOFOL  Patient location during evaluation: Endoscopy Anesthesia Type: General Level of consciousness: awake and alert Pain management: pain level controlled Vital Signs Assessment: post-procedure vital signs reviewed and stable Respiratory status: spontaneous breathing, nonlabored ventilation, respiratory function stable and patient connected to nasal cannula oxygen Cardiovascular status: blood pressure returned to baseline and stable Postop Assessment: no apparent nausea or vomiting Anesthetic complications: no   No notable events documented.   Last Vitals:  Vitals:   09/20/21 0920 09/20/21 0930  BP: 124/72 (!) 142/85  Pulse: 65 (!) 102  Resp: (!) 23 (!) 32  Temp:    SpO2: 100% 95%    Last Pain:  Vitals:   09/20/21 0900  TempSrc: Temporal  PainSc:                  Martha Clan

## 2021-10-03 ENCOUNTER — Encounter: Payer: Self-pay | Admitting: Family Medicine

## 2021-10-03 ENCOUNTER — Ambulatory Visit (INDEPENDENT_AMBULATORY_CARE_PROVIDER_SITE_OTHER): Payer: BC Managed Care – PPO | Admitting: Family Medicine

## 2021-10-03 ENCOUNTER — Other Ambulatory Visit: Payer: Self-pay

## 2021-10-03 VITALS — BP 135/91 | HR 89 | Resp 15 | Wt 213.2 lb

## 2021-10-03 DIAGNOSIS — J351 Hypertrophy of tonsils: Secondary | ICD-10-CM

## 2021-10-03 DIAGNOSIS — J029 Acute pharyngitis, unspecified: Secondary | ICD-10-CM | POA: Diagnosis not present

## 2021-10-03 DIAGNOSIS — F411 Generalized anxiety disorder: Secondary | ICD-10-CM

## 2021-10-03 DIAGNOSIS — F5101 Primary insomnia: Secondary | ICD-10-CM

## 2021-10-03 MED ORDER — PAROXETINE HCL 20 MG PO TABS: 20.0000 mg | ORAL_TABLET | Freq: Every day | ORAL | 3 refills | Status: DC

## 2021-10-03 MED ORDER — GUAIFENESIN-CODEINE 100-10 MG/5ML PO SOLN: 10.0000 mL | Freq: Every evening | ORAL | 0 refills | Status: DC | PRN

## 2021-10-03 MED ORDER — TRAZODONE HCL 50 MG PO TABS
25.0000 mg | ORAL_TABLET | ORAL | 3 refills | Status: DC | PRN
Start: 2021-10-03 — End: 2022-01-26

## 2021-10-03 MED ORDER — ALPRAZOLAM 0.5 MG PO TABS: 0.5000 mg | ORAL_TABLET | Freq: Two times a day (BID) | ORAL | 1 refills | Status: DC | PRN

## 2021-10-03 MED ORDER — TRAZODONE HCL 100 MG PO TABS: ORAL_TABLET | ORAL | 1 refills | Status: DC

## 2021-10-03 NOTE — Patient Instructions (Addendum)
Recommend Over the counter medications No signs of bacterial component at this time  Anti-histamine meds:  Zyrtec/claritin- pick one tablet, daily Flonase- twice a day, nasal spray  Mucinex- ensure water consumption is 64 oz or higher/day  Robitussin DM  Cough drops with honey  Cough syrup provided at night to assist with cough- has mucinex in it  Take tylenol for fevers >101

## 2021-10-03 NOTE — Assessment & Plan Note (Signed)
Plans to engage in therapy Off abilify d/t off side effect- tingling/numness along face

## 2021-10-03 NOTE — Progress Notes (Signed)
Established patient visit   Patient: Carrie Stokes   DOB: Feb 16, 1976   45 y.o. Female  MRN: [id number] Visit Date: 10/03/2021  Today's healthcare provider: [name], FNP   Chief Complaint  Patient presents with   Anxiety   URI   Subjective    HPI HPI     URI   Associated symptoms inlclude achiness, congestion, chills, cough, ear pain, headache, night sweats, shortness of breath and sore throat.  Recent episode started in the past 7 days.  The problem has been unchanged since onset.  The temperature has been with in normal range.  Patient  is drinking plenty of fluids.  Patient is not a smoker.      Last edited by Minette Headland, CMA on 10/03/2021  8:10 AM.      Anxiety, Follow-up  She was last seen for anxiety 2 months ago. Changes made at last visit include Will Start Paxil 20 mg daily, Continue Xanax for now, but did decrease number of pills from 60 to 40/month.   She reports excellent compliance with treatment. She reports excellent tolerance of treatment. She is not having side effects.   She feels her anxiety is moderate and Unchanged since last visit.  Symptoms: No chest pain Yes difficulty concentrating  No dizziness No fatigue  No feelings of losing control No insomnia  Yes irritable Yes palpitations  Yes panic attacks Yes racing thoughts  No shortness of breath Yes sweating  No tremors/shakes    GAD-7 Results No flowsheet data found.  PHQ-9 Scores PHQ9 SCORE ONLY 10/03/2021 08/15/2021  PHQ-9 Total Score 12 8    ---------------------------------------------------------------------------------------------------   Medications: Outpatient Medications Prior to Visit  Medication Sig   Cholecalciferol (VITAMIN D3 PO) Take by mouth.   ferrous gluconate (FERGON) 240 (27 FE) MG tablet TAKE 1 TABLET BY MOUTH 2 TIMES A DAY   meloxicam (MOBIC) 15 MG tablet Take 15 mg by mouth daily.   Multiple Vitamin (MULTIVITAMINS PO) Take by mouth.    polyethylene glycol-electrolytes (NULYTELY) 420 g solution Take by mouth.   [DISCONTINUED] ALPRAZolam (XANAX) 0.5 MG tablet Take 1 tablet (0.5 mg total) by mouth 2 (two) times daily as needed for anxiety.   [DISCONTINUED] PARoxetine (PAXIL) 20 MG tablet TAKE 1 TABLET BY MOUTH EVERY DAY   [DISCONTINUED] traZODone (DESYREL) 100 MG tablet TAKE 1 TABLET BY MOUTH AT BEDTIME AS NEEDED   levonorgestrel (MIRENA, 52 MG,) 20 MCG/24HR IUD by Intrauterine route once for 1 dose.   [DISCONTINUED] diclofenac (VOLTAREN) 50 MG EC tablet TAKE 1 TABLET BY MOUTH TWICE A DAY WITH FOOD AS NEEDED FOR PAIN AND SWELLING (Patient not taking: Reported on 10/03/2021)   [DISCONTINUED] silver sulfADIAZINE (SILVADENE) 1 % cream Apply topically daily. (Patient not taking: Reported on 10/03/2021)   No facility-administered medications prior to visit.    Review of Systems     Objective    BP (!) 135/91   Pulse 89   Resp 15   Wt 213 lb 3.2 oz (96.7 kg)   SpO2 98%   BMI 37.77 kg/m    Physical Exam Vitals and nursing note reviewed.  Constitutional:      General: She is not in acute distress.    Appearance: Normal appearance. She is obese. She is not ill-appearing, toxic-appearing or diaphoretic.  HENT:     Head: Normocephalic and atraumatic.     Jaw: There is normal jaw occlusion.     Salivary Glands: Right salivary gland  is not diffusely enlarged or tender. Left salivary gland is not diffusely enlarged or tender.     Right Ear: Tympanic membrane, ear canal and external ear normal. No swelling or tenderness. There is no impacted cerumen.     Left Ear: Tympanic membrane, ear canal and external ear normal. No swelling or tenderness. There is no impacted cerumen.     Nose:     Right Turbinates: Not enlarged or swollen.     Left Turbinates: Pale. Not enlarged or swollen.     Right Sinus: Frontal sinus tenderness present. No maxillary sinus tenderness.     Left Sinus: Frontal sinus tenderness present. No maxillary  sinus tenderness.     Mouth/Throat:     Mouth: Mucous membranes are moist.     Tongue: No lesions.     Palate: No mass and lesions.     Pharynx: Oropharynx is clear. Uvula midline. No pharyngeal swelling, oropharyngeal exudate, posterior oropharyngeal erythema or uvula swelling.     Tonsils: No tonsillar exudate or tonsillar abscesses. 1+ on the right. 1+ on the left.  Cardiovascular:     Rate and Rhythm: Normal rate and regular rhythm.     Pulses: Normal pulses.     Heart sounds: Normal heart sounds. No murmur heard.   No friction rub. No gallop.  Pulmonary:     Effort: Pulmonary effort is normal. No respiratory distress.     Breath sounds: Normal breath sounds. No stridor. No wheezing, rhonchi or rales.  Chest:     Chest wall: No tenderness.  Abdominal:     General: Bowel sounds are normal.     Palpations: Abdomen is soft.  Musculoskeletal:        General: No swelling, tenderness, deformity or signs of injury. Normal range of motion.     Right lower leg: No edema.     Left lower leg: No edema.  Skin:    General: Skin is warm and dry.     Capillary Refill: Capillary refill takes less than 2 seconds.     Coloration: Skin is not jaundiced or pale.     Findings: No bruising, erythema, lesion or rash.  Neurological:     General: No focal deficit present.     Mental Status: She is alert and oriented to person, place, and time. Mental status is at baseline.     Cranial Nerves: No cranial nerve deficit.     Sensory: No sensory deficit.     Motor: No weakness.     Coordination: Coordination normal.  Psychiatric:        Mood and Affect: Mood normal.        Behavior: Behavior normal.        Thought Content: Thought content normal.        Judgment: Judgment normal.     No results found for any visits on 10/03/21.  Assessment & Plan     Problem List Items Addressed This Visit       Respiratory   Acute pharyngitis    Advised likely viral in natural or allergic Continue  allergy medications- adding flonase Add OTC meds to assist with symptomatic relief         Other   Anxiety, generalized    Plans to engage in therapy Off abilify d/t off side effect- tingling/numness along face      Relevant Medications   PARoxetine (PAXIL) 20 MG tablet   ALPRAZolam (XANAX) 0.5 MG tablet   traZODone (DESYREL) 100 MG tablet  traZODone (DESYREL) 50 MG tablet   Cannot sleep    Waking in middle of time, new smaller dose of as needed trazodone provided to assist with 3 am wakenings       Relevant Medications   traZODone (DESYREL) 100 MG tablet   traZODone (DESYREL) 50 MG tablet   Enlarged tonsils - Primary    Slight enlargement; reassurance provided Continue to monitor No need for ABX at this time given short duration of symptoms <5 days        Return in about 3 months (around 01/03/2022), or if symptoms worsen or fail to improve, for anxiety and depression.     Vonna Kotyk, FNP, have reviewed all documentation for this visit. The documentation on 10/03/21 for the exam, diagnosis, procedures, and orders are all accurate and complete.    [name], [place] [phone] (phone) [phone] (fax)  [place]

## 2021-10-03 NOTE — Assessment & Plan Note (Signed)
Slight enlargement; reassurance provided Continue to monitor No need for ABX at this time given short duration of symptoms <5 days

## 2021-10-03 NOTE — Assessment & Plan Note (Signed)
Waking in middle of time, new smaller dose of as needed trazodone provided to assist with 3 am wakenings

## 2021-10-03 NOTE — Assessment & Plan Note (Signed)
Advised likely viral in natural or allergic Continue allergy medications- adding flonase Add OTC meds to assist with symptomatic relief

## 2021-10-24 NOTE — Telephone Encounter (Signed)
Mirena rcvd/charged 04/20/2019

## 2021-10-25 ENCOUNTER — Other Ambulatory Visit: Payer: Self-pay | Admitting: Family Medicine

## 2021-10-25 DIAGNOSIS — F5101 Primary insomnia: Secondary | ICD-10-CM

## 2021-11-26 ENCOUNTER — Other Ambulatory Visit: Payer: Self-pay | Admitting: Family Medicine

## 2021-11-26 DIAGNOSIS — F5101 Primary insomnia: Secondary | ICD-10-CM

## 2021-11-26 NOTE — Telephone Encounter (Signed)
unable to give 90 day RF- pt needs to make appt for February for f/u

## 2021-12-06 ENCOUNTER — Other Ambulatory Visit: Payer: Self-pay | Admitting: Family Medicine

## 2021-12-06 DIAGNOSIS — F411 Generalized anxiety disorder: Secondary | ICD-10-CM

## 2021-12-06 NOTE — Telephone Encounter (Signed)
Requested medication (s) are due for refill today: yes  Requested medication (s) are on the active medication list: yes    Last refill: 10/03/21 #40  1 refill  Future visit scheduled no  Notes to clinic:  Not delegated, please review  Requested Prescriptions  Pending Prescriptions Disp Refills   ALPRAZolam (XANAX) 0.5 MG tablet [Pharmacy Med Name: ALPRAZOLAM 0.5 MG TABLET] 40 tablet 1    Sig: TAKE 1 TABLET BY MOUTH 2 TIMES DAILY AS NEEDED FOR ANXIETY.     Not Delegated - Psychiatry:  Anxiolytics/Hypnotics Failed - 12/06/2021  9:02 AM      Failed - This refill cannot be delegated      Failed - Urine Drug Screen completed in last 360 days      Passed - Valid encounter within last 6 months    Recent Outpatient Visits           2 months ago Anxiety, generalized   [facility] [name], FNP   3 months ago Anxiety, generalized   [facility] [place], Dionne Bucy, MD   1 year ago Cough   [place], [place], [place]   3 years ago Yeast vaginitis   [place], [place], [place]   4 years ago Class 3 obesity due to excess calories with serious comorbidity and body mass index (BMI) of 40.0 to 44.9 in adult [facility])   [place], [place], [place]

## 2021-12-22 ENCOUNTER — Encounter: Payer: Self-pay | Admitting: Obstetrics and Gynecology

## 2021-12-25 ENCOUNTER — Other Ambulatory Visit: Payer: Self-pay | Admitting: Obstetrics and Gynecology

## 2021-12-25 DIAGNOSIS — B9689 Other specified bacterial agents as the cause of diseases classified elsewhere: Secondary | ICD-10-CM

## 2021-12-25 DIAGNOSIS — N760 Acute vaginitis: Secondary | ICD-10-CM

## 2021-12-25 MED ORDER — METRONIDAZOLE 500 MG PO TABS: 500.0000 mg | ORAL_TABLET | Freq: Two times a day (BID) | ORAL | 0 refills | Status: AC

## 2021-12-25 NOTE — Progress Notes (Signed)
Rx RF flagyl for BV sx

## 2021-12-30 ENCOUNTER — Other Ambulatory Visit: Payer: Self-pay | Admitting: Family Medicine

## 2021-12-30 DIAGNOSIS — F5101 Primary insomnia: Secondary | ICD-10-CM

## 2022-01-01 NOTE — Telephone Encounter (Signed)
Requested medication (s) are due for refill today:   No  Requested medication (s) are on the active medication list:   Yes  Future visit scheduled:   No   Last ordered: 10/03/2021 #90, 1 refill  Returned because a 90 day supply is being requested along with a DX Code.     Requested Prescriptions  Pending Prescriptions Disp Refills   traZODone (DESYREL) 50 MG tablet [Pharmacy Med Name: TRAZODONE 50 MG TABLET] 90 tablet 2    Sig: TAKE 1/2 TO 1 TABLET BY MOUTH AS NEEDED FOR SLEEP (IF WAKE UP IN NIGHT AND CAN'T SLEEP)     Psychiatry: Antidepressants - Serotonin Modulator Passed - 12/30/2021  9:33 AM      Passed - Completed PHQ-2 or PHQ-9 in the last 360 days      Passed - Valid encounter within last 6 months    Recent Outpatient Visits           3 months ago Anxiety, generalized   [facility] [name], FNP   4 months ago Anxiety, generalized   [facility] [place], Dionne Bucy, MD   1 year ago Cough   [place], [place], [place]   3 years ago Yeast vaginitis   [place], [place], [place]   4 years ago Class 3 obesity due to excess calories with serious comorbidity and body mass index (BMI) of 40.0 to 44.9 in adult [facility])   [facility] [place], [place], [place]

## 2022-01-26 ENCOUNTER — Other Ambulatory Visit: Payer: Self-pay | Admitting: Family Medicine

## 2022-01-26 DIAGNOSIS — F5101 Primary insomnia: Secondary | ICD-10-CM

## 2022-01-26 NOTE — Telephone Encounter (Signed)
Requested Prescriptions  ?Pending Prescriptions Disp Refills  ?? traZODone (DESYREL) 50 MG tablet [Pharmacy Med Name: TRAZODONE 50 MG TABLET] 30 tablet 3  ?  Sig: TAKE 1/2 TO 1 TABLET BY MOUTH AS NEEDED FOR SLEEP (IF WAKE UP IN NIGHT AND CAN'T SLEEP)  ?  ? Psychiatry: Antidepressants - Serotonin Modulator Passed - 01/26/2022 12:28 PM  ?  ?  Passed - Completed PHQ-2 or PHQ-9 in the last 360 days  ?  ?  Passed - Valid encounter within last 6 months  ?  Recent Outpatient Visits   ?      ? 3 months ago Anxiety, generalized  ? [facility] [name], FNP  ? 5 months ago Anxiety, generalized  ? [facility] [place], Dionne Bucy, MD  ? 1 year ago Cough  ? [place], [place]  ? 3 years ago Yeast vaginitis  ? [place], [place]  ? 5 years ago Class 3 obesity due to excess calories with serious comorbidity and body mass index (BMI) of 40.0 to 44.9 in adult [facility])  ? [facility] [place], Anderson Malta M, [place]  ?  ?  ? ?  ?  ?  ? ?

## 2022-01-29 ENCOUNTER — Encounter: Payer: Self-pay | Admitting: Obstetrics and Gynecology

## 2022-01-29 ENCOUNTER — Other Ambulatory Visit: Payer: Self-pay

## 2022-01-29 ENCOUNTER — Ambulatory Visit
Admission: RE | Admit: 2022-01-29 | Discharge: 2022-01-29 | Disposition: A | Discharge status: Home / Self Care | Payer: BC Managed Care – PPO | Source: Ambulatory Visit | Attending: Obstetrics and Gynecology | Admitting: Obstetrics and Gynecology

## 2022-01-29 ENCOUNTER — Ambulatory Visit: Payer: BC Managed Care – PPO | Admitting: Obstetrics and Gynecology

## 2022-01-29 ENCOUNTER — Ambulatory Visit (INDEPENDENT_AMBULATORY_CARE_PROVIDER_SITE_OTHER): Payer: BC Managed Care – PPO

## 2022-01-29 VITALS — BP 148/100 | Ht 63.0 in | Wt 217.0 lb

## 2022-01-29 DIAGNOSIS — R399 Unspecified symptoms and signs involving the genitourinary system: Secondary | ICD-10-CM | POA: Diagnosis not present

## 2022-01-29 DIAGNOSIS — Z113 Encounter for screening for infections with a predominantly sexual mode of transmission: Secondary | ICD-10-CM | POA: Diagnosis not present

## 2022-01-29 DIAGNOSIS — R102 Pelvic and perineal pain: Secondary | ICD-10-CM | POA: Insufficient documentation

## 2022-01-29 DIAGNOSIS — Z30431 Encounter for routine checking of intrauterine contraceptive device: Secondary | ICD-10-CM | POA: Diagnosis not present

## 2022-01-29 DIAGNOSIS — N83201 Unspecified ovarian cyst, right side: Secondary | ICD-10-CM | POA: Diagnosis not present

## 2022-01-29 LAB — POCT URINALYSIS DIPSTICK
Bilirubin, UA: NEGATIVE
Blood, UA: NEGATIVE
Glucose, UA: NEGATIVE
Ketones, UA: NEGATIVE
Leukocytes, UA: NEGATIVE
Nitrite, UA: NEGATIVE
Protein, UA: NEGATIVE
Spec Grav, UA: 1.01 (ref 1.010–1.025)
pH, UA: 6 (ref 5.0–8.0)

## 2022-01-29 LAB — POCT URINE PREGNANCY: Preg Test, Ur: NEGATIVE

## 2022-01-29 MED ORDER — METRONIDAZOLE 500 MG PO TABS: ORAL_TABLET | ORAL | 0 refills | Status: DC

## 2022-01-29 MED ORDER — DOXYCYCLINE HYCLATE 100 MG PO CAPS
100.0000 mg | ORAL_CAPSULE | Freq: Two times a day (BID) | ORAL | 0 refills | Status: DC
Start: 2022-01-29 — End: 2022-02-12

## 2022-01-29 MED ORDER — CEFTRIAXONE SODIUM 500 MG IJ SOLR
500.0000 mg | Freq: Once | INTRAMUSCULAR | Status: AC
  Administered 2022-01-29: 500 mg via INTRAMUSCULAR

## 2022-01-29 NOTE — Progress Notes (Signed)
Pt received rocephin injection IM LUOQ. Pt tolerated it well. ?

## 2022-01-29 NOTE — Progress Notes (Signed)
Carrie Sprout, FNP   Chief Complaint  Patient presents with   Urinary Tract Infection    Frequent and painful urinating, pelvic pain x 2 weeks   IUD check    Pelvic pain and pain during intercourse    HPI:      Carrie Stokes is a 46 y.o. G2P1011 whose LMP was No LMP recorded. (Menstrual status: IUD)., presents today for pelvic pain for the past 2 wks. Sx started with sex (no bleeding this time) and have persisted. Pain is throbbing, worse with sitting, radiating to vagina; improved with standing/walking; improved with NSAIDs/heating pad.  Also with urinary frequency/urgency for the past 2 wks, sometimes with good flow. No hematuria, LBP, fevers, no vag sx. No GI sx.  She is sex active, no new partners. Neg STD testing 9/22. Mirena REplaced 04/20/19. Amenorrheic, no dysmen. Does have occas pain with sex and light bleeding afterwards. Had discussed checking GYN u/s at 10/22 appt if sx persisted because sx were recent, but u/s not done.   Patient Active Problem List   Diagnosis Date Noted   Enlarged tonsils 10/03/2021   Acute pharyngitis 10/03/2021   Colon cancer screening    Cervical high risk human papillomavirus (HPV) DNA test positive 08/22/2021   BV (bacterial vaginosis) 08/16/2021   Cystic acne 05/19/2019   Varicose veins with pain 08/03/2017   Lymphedema 04/08/2017   Chronic venous insufficiency 04/08/2017   Abnormal thyroid stimulating hormone (TSH) level 10/31/2015   Anxiety, generalized 10/31/2015   Obesity 10/31/2015   Family planning 10/31/2015   Depression, major, in remission ([place]) 10/31/2015   Cannot sleep 10/31/2015   Avitaminosis D 10/31/2015   Anemia, iron deficiency 10/26/2015   Genital herpes 11/23/2004    Past Surgical History:  Procedure Laterality Date   APPENDECTOMY  2005   CHOLECYSTECTOMY  2005   Dr. Jamal Collin   COLONOSCOPY WITH PROPOFOL N/A 09/20/2021   Procedure: COLONOSCOPY WITH PROPOFOL;  Surgeon: Virgel Manifold, MD;  Location:  [facility] ENDOSCOPY;  Service: Endoscopy;  Laterality: N/A;   GASTRIC BYPASS     KNEE ARTHROSCOPY W/ ACL RECONSTRUCTION Right 07/13/2015   Dr. Para March (miniscus)    Family History  Problem Relation Age of Onset   Asthma Mother    Colon cancer Maternal Aunt 19       stage 4, mtes to lung and liver   Lung cancer Maternal Aunt    Lung cancer Maternal Grandmother    Breast cancer Other     Social History   Socioeconomic History   Marital status: Married    Spouse name: Not on file   Number of children: Not on file   Years of education: Not on file   Highest education level: Not on file  Occupational History   Not on file  Tobacco Use   Smoking status: Never   Smokeless tobacco: Never  Vaping Use   Vaping Use: Never used  Substance and Sexual Activity   Alcohol use: Yes    Comment: occasional   Drug use: No   Sexual activity: Yes    Birth control/protection: I.U.D.    Comment: Mirena  Other Topics Concern   Not on file  Social History Narrative   Not on file   Social Determinants of Health   Financial Resource Strain: Not on file  Food Insecurity: Not on file  Transportation Needs: Not on file  Physical Activity: Not on file  Stress: Not on file  Social Connections: Not on  file  Intimate Partner Violence: Not on file    Outpatient Medications Prior to Visit  Medication Sig Dispense Refill   ALPRAZolam (XANAX) 0.5 MG tablet TAKE 1 TABLET BY MOUTH 2 TIMES DAILY AS NEEDED FOR ANXIETY. 40 tablet 1   Cholecalciferol (VITAMIN D3 PO) Take by mouth.     ferrous gluconate (FERGON) 240 (27 FE) MG tablet TAKE 1 TABLET BY MOUTH 2 TIMES A DAY 100 tablet 5   Multiple Vitamin (MULTIVITAMINS PO) Take by mouth.     PARoxetine (PAXIL) 20 MG tablet Take 1 tablet (20 mg total) by mouth daily. 90 tablet 3   traZODone (DESYREL) 100 MG tablet TAKE 1 TABLET BY MOUTH AT BEDTIME AS NEEDED 90 tablet 1   traZODone (DESYREL) 50 MG tablet TAKE 1/2 TO 1 TABLET BY MOUTH AS NEEDED FOR SLEEP (IF WAKE  UP IN NIGHT AND CAN'T SLEEP) 30 tablet 3   levonorgestrel (MIRENA, 52 MG,) 20 MCG/24HR IUD by Intrauterine route once for 1 dose. 1 each 0   guaiFENesin-codeine 100-10 MG/5ML syrup Take 10 mLs by mouth at bedtime and may repeat dose one time if needed. 120 mL 0   meloxicam (MOBIC) 15 MG tablet Take 15 mg by mouth daily.     polyethylene glycol-electrolytes (NULYTELY) 420 g solution Take by mouth.     No facility-administered medications prior to visit.      ROS:  Review of Systems  Constitutional:  Negative for fever.  Gastrointestinal:  Negative for blood in stool, constipation, diarrhea, nausea and vomiting.  Genitourinary:  Positive for dysuria, frequency, pelvic pain, urgency and vaginal pain. Negative for dyspareunia, flank pain, hematuria, vaginal bleeding and vaginal discharge.  Musculoskeletal:  Negative for back pain.  Skin:  Negative for rash.  BREAST: No symptoms   OBJECTIVE:   Vitals:  BP (!) 148/100    Ht '5\' 3"'$  (1.6 m)    Wt 217 lb (98.4 kg)    BMI 38.44 kg/m   Physical Exam Vitals reviewed.  Constitutional:      Appearance: She is well-developed.  Pulmonary:     Effort: Pulmonary effort is normal.  Abdominal:     Palpations: Abdomen is soft.     Tenderness: There is abdominal tenderness in the right lower quadrant, suprapubic area and left lower quadrant. There is no guarding or rebound.  Genitourinary:    General: Normal vulva.     Pubic Area: No rash.      Labia:        Right: No rash, tenderness or lesion.        Left: No rash, tenderness or lesion.      Vagina: Normal. No vaginal discharge, erythema or tenderness.     Cervix: Cervical motion tenderness present.     Uterus: Normal. Tender. Not enlarged.      Adnexa: Left adnexa normal.       Right: Tenderness present. No mass.         Left: No mass or tenderness.       Comments: IUD STRINGS IN CX OS Musculoskeletal:        General: Normal range of motion.     Cervical back: Normal range of motion.   Skin:    General: Skin is warm and dry.  Neurological:     General: No focal deficit present.     Mental Status: She is alert and oriented to person, place, and time.  Psychiatric:        Mood and Affect: Mood normal.  Behavior: Behavior normal.        Thought Content: Thought content normal.        Judgment: Judgment normal.    Results: Results for orders placed or performed in visit on 01/29/22 (from the past 24 hour(s))  POCT Urinalysis Dipstick     Status: Normal   Collection Time: 01/29/22  2:15 PM  Result Value Ref Range   Color, UA pale    Clarity, UA clear    Glucose, UA Negative Negative   Bilirubin, UA neg    Ketones, UA neg    Spec Grav, UA 1.010 1.010 - 1.025   Blood, UA neg    pH, UA 6.0 5.0 - 8.0   Protein, UA Negative Negative   Urobilinogen, UA     Nitrite, UA neg    Leukocytes, UA Negative Negative   Appearance     Odor    POCT urine pregnancy     Status: Normal   Collection Time: 01/29/22  2:27 PM  Result Value Ref Range   Preg Test, Ur Negative Negative    EXAM: TRANSABDOMINAL AND TRANSVAGINAL ULTRASOUND OF PELVIS   TECHNIQUE: Both transabdominal and transvaginal ultrasound examinations of the pelvis were performed. Transabdominal technique was performed for global imaging of the pelvis including uterus, ovaries, adnexal regions, and pelvic cul-de-sac. It was necessary to proceed with endovaginal exam following the transabdominal exam to visualize the endometrium.   COMPARISON:  None   FINDINGS: Uterus   Measurements: 8.1 x 3.9 x 4.3 cm = volume: 71 mL. IUD in expected location in the endometrial canal of the uterine body.   Endometrium   Thickness: 3.8.  No focal abnormality visualized.   Right ovary   Measurements: 5.5 x 2.4 x 3.1 cm = volume: 21 mL. Anechoic 2.6 cm cyst. No internal complexity or blood flow.   Left ovary   Measurements: 2.2 x 1.7 x 2.0 cm = volume: 4 mL.  Normal   Other findings   No abnormal free  fluid.   IMPRESSION: 1. IUD in expected location. 2. 2.6 cm right ovarian follicle, normal finding. No follow-up imaging is recommended. Reference: Radiology 2019 Nov;293(2):359-371     Electronically Signed   By: Suzy Bouchard M.D.   On: 01/29/2022 15:30    Assessment/Plan: Pelvic pain - Plan: US PELVIC COMPLETE WITH TRANSVAGINAL, POCT Urinalysis Dipstick, POCT urine pregnancy, Chlamydia/Gonococcus/Trichomonas, NAA; neg UPT, neg UA. Very tender on exam with CMT. Rule out STDs, check Gyn u/s, check IUD placement.   GYN STAT u/s was normal, IUD in correct location. 2.6 cm RTO follicle, not likely to be cause of significant sx. Will treat for PID. Rx rocephin, doxy, and flagyl. RTO in 2 wks for f/u/sooner prn. NSAIDs/heating pad prn.   UTI symptoms - Plan: POCT Urinalysis Dipstick, Urine Culture; neg UA, check C&S. Will f/u if pos.   Screening for STD (sexually transmitted disease) - Plan: Chlamydia/Gonococcus/Trichomonas, NAA  Encounter for routine checking of intrauterine contraceptive device (IUD) - Plan: US PELVIC COMPLETE WITH TRANSVAGINAL     Return if symptoms worsen or fail to improve.  Tishana Clinkenbeard B. Orian Figueira, PA-C 01/29/2022 2:29 PM

## 2022-01-30 ENCOUNTER — Other Ambulatory Visit: Payer: Self-pay | Admitting: Obstetrics and Gynecology

## 2022-01-30 DIAGNOSIS — R102 Pelvic and perineal pain: Secondary | ICD-10-CM

## 2022-01-30 LAB — CHLAMYDIA/GONOCOCCUS/TRICHOMONAS, NAA
Chlamydia by NAA: NEGATIVE
Gonococcus by NAA: NEGATIVE
Trich vag by NAA: NEGATIVE

## 2022-01-30 MED ORDER — METRONIDAZOLE 500 MG PO TABS: ORAL_TABLET | ORAL | 0 refills | Status: DC

## 2022-01-31 LAB — URINE CULTURE: Organism ID, Bacteria: NO GROWTH

## 2022-02-02 ENCOUNTER — Encounter: Payer: Self-pay | Admitting: Obstetrics and Gynecology

## 2022-02-02 ENCOUNTER — Other Ambulatory Visit: Payer: Self-pay | Admitting: Obstetrics and Gynecology

## 2022-02-02 MED ORDER — FLUCONAZOLE 150 MG PO TABS: 150.0000 mg | ORAL_TABLET | Freq: Once | ORAL | 0 refills | Status: AC

## 2022-02-02 NOTE — Progress Notes (Signed)
Rx diflucan for yeast vag sx on abx ?

## 2022-02-11 ENCOUNTER — Other Ambulatory Visit: Payer: Self-pay | Admitting: Family Medicine

## 2022-02-11 DIAGNOSIS — F411 Generalized anxiety disorder: Secondary | ICD-10-CM

## 2022-02-12 ENCOUNTER — Encounter: Payer: Self-pay | Admitting: Obstetrics and Gynecology

## 2022-02-12 ENCOUNTER — Ambulatory Visit: Payer: BC Managed Care – PPO

## 2022-02-12 ENCOUNTER — Ambulatory Visit: Payer: BC Managed Care – PPO | Admitting: Obstetrics and Gynecology

## 2022-02-12 ENCOUNTER — Other Ambulatory Visit: Payer: Self-pay

## 2022-02-12 VITALS — BP 120/82 | Ht 63.0 in | Wt 216.0 lb

## 2022-02-12 DIAGNOSIS — R102 Pelvic and perineal pain: Secondary | ICD-10-CM

## 2022-02-12 DIAGNOSIS — R1031 Right lower quadrant pain: Secondary | ICD-10-CM | POA: Diagnosis not present

## 2022-02-12 NOTE — Progress Notes (Signed)
? ? ?[name], FNP ? ? ?Chief Complaint  ?Patient presents with  ? Follow-up  ?  Feeling better, still having some right side pelvic pain  ? ? ?HPI: ?     [name] is a 46 y.o. G2P1011 whose LMP was No LMP recorded. (Menstrual status: IUD)., presents today for presumptive PID from 01/29/22 and 2 wks abx tx. Had neg STD testing, neg GYN u/s. IUD in correct location. Sx are much improved. No longer has significant pelvic pain, no urinary sx. Has mild RLQ discomfort but GYN u/s showed 2.6 cm RTO follicle. Pt has mild discomfort with sex one time but much improved. Has completed abx. Did diflucan for yeast vag sx after abx with sx relief. Feels much better. No urin, vag sx, no LBP, no fevers.  ?Does notice some discomfort when gets upset/anxious but also has loose stools/GI sx which can cause pelvic discomfort.  ? ? ?Patient Active Problem List  ? Diagnosis Date Noted  ? Enlarged tonsils 10/03/2021  ? Acute pharyngitis 10/03/2021  ? Colon cancer screening   ? Cervical high risk human papillomavirus (HPV) DNA test positive 08/22/2021  ? BV (bacterial vaginosis) 08/16/2021  ? Cystic acne 05/19/2019  ? Varicose veins with pain 08/03/2017  ? Lymphedema 04/08/2017  ? Chronic venous insufficiency 04/08/2017  ? Abnormal thyroid stimulating hormone (TSH) level 10/31/2015  ? Anxiety, generalized 10/31/2015  ? Obesity 10/31/2015  ? Family planning 10/31/2015  ? Depression, major, in remission ([place]) 10/31/2015  ? Cannot sleep 10/31/2015  ? Avitaminosis D 10/31/2015  ? Anemia, iron deficiency 10/26/2015  ? Genital herpes 11/23/2004  ? ? ?Past Surgical History:  ?Procedure Laterality Date  ? APPENDECTOMY  2005  ? CHOLECYSTECTOMY  2005  ? Dr. Jamal Collin  ? COLONOSCOPY WITH PROPOFOL N/A 09/20/2021  ? Procedure: COLONOSCOPY WITH PROPOFOL;  Surgeon: Virgel Manifold, MD;  Location: [facility] ENDOSCOPY;  Service: Endoscopy;  Laterality: N/A;  ? GASTRIC BYPASS    ? KNEE ARTHROSCOPY W/ ACL RECONSTRUCTION Right 07/13/2015   ? Dr. Para March (miniscus)  ? ? ?Family History  ?Problem Relation Age of Onset  ? Asthma Mother   ? Colon cancer Maternal Aunt 60  ?     stage 4, mtes to lung and liver  ? Lung cancer Maternal Aunt   ? Lung cancer Maternal Grandmother   ? Breast cancer Other   ? ? ?Social History  ? ?Socioeconomic History  ? Marital status: Married  ?  Spouse name: Not on file  ? Number of children: Not on file  ? Years of education: Not on file  ? Highest education level: Not on file  ?Occupational History  ? Not on file  ?Tobacco Use  ? Smoking status: Never  ? Smokeless tobacco: Never  ?Vaping Use  ? Vaping Use: Never used  ?Substance and Sexual Activity  ? Alcohol use: Yes  ?  Comment: occasional  ? Drug use: No  ? Sexual activity: Yes  ?  Birth control/protection: I.U.D.  ?  Comment: Mirena  ?Other Topics Concern  ? Not on file  ?Social History Narrative  ? Not on file  ? ?Social Determinants of Health  ? ?Financial Resource Strain: Not on file  ?Food Insecurity: Not on file  ?Transportation Needs: Not on file  ?Physical Activity: Not on file  ?Stress: Not on file  ?Social Connections: Not on file  ?Intimate Partner Violence: Not on file  ? ? ?Outpatient Medications Prior to Visit  ?Medication Sig  Dispense Refill  ? ALPRAZolam (XANAX) 0.5 MG tablet TAKE 1 TABLET BY MOUTH 2 TIMES DAILY AS NEEDED FOR ANXIETY. 40 tablet 1  ? Cholecalciferol (VITAMIN D3 PO) Take by mouth.    ? ferrous gluconate (FERGON) 240 (27 FE) MG tablet TAKE 1 TABLET BY MOUTH 2 TIMES A DAY 100 tablet 5  ? Multiple Vitamin (MULTIVITAMINS PO) Take by mouth.    ? PARoxetine (PAXIL) 20 MG tablet Take 1 tablet (20 mg total) by mouth daily. 90 tablet 3  ? traZODone (DESYREL) 100 MG tablet TAKE 1 TABLET BY MOUTH AT BEDTIME AS NEEDED 90 tablet 1  ? traZODone (DESYREL) 50 MG tablet TAKE 1/2 TO 1 TABLET BY MOUTH AS NEEDED FOR SLEEP (IF WAKE UP IN NIGHT AND CAN'T SLEEP) 30 tablet 3  ? levonorgestrel (MIRENA, 52 MG,) 20 MCG/24HR IUD by Intrauterine route once for 1 dose. 1  each 0  ? doxycycline (VIBRAMYCIN) 100 MG capsule Take 1 capsule (100 mg total) by mouth 2 (two) times daily for 14 days. 28 capsule 0  ? metroNIDAZOLE (FLAGYL) 500 MG tablet Take 1 tab BID for 14 days; NO alcohol use for 17 days after prescription start 28 tablet 0  ? ?No facility-administered medications prior to visit.  ? ? ? ? ?ROS: ? ?Review of Systems  ?Constitutional:  Negative for fever.  ?Gastrointestinal:  Negative for blood in stool, constipation, diarrhea, nausea and vomiting.  ?Genitourinary:  Negative for dyspareunia, dysuria, flank pain, frequency, hematuria, urgency, vaginal bleeding, vaginal discharge and vaginal pain.  ?Musculoskeletal:  Negative for back pain.  ?Skin:  Negative for rash.  ?BREAST: No symptoms ? ? ?OBJECTIVE:  ? ?Vitals:  ?BP 120/82   Ht '5\' 3"'$  (1.6 m)   Wt 216 lb (98 kg)   BMI 38.26 kg/m?  ? ?Physical Exam ?Vitals reviewed.  ?Constitutional:   ?   Appearance: She is well-developed.  ?Pulmonary:  ?   Effort: Pulmonary effort is normal.  ?Abdominal:  ?   Palpations: Abdomen is soft.  ?   Tenderness: There is abdominal tenderness in the right lower quadrant. There is no guarding or rebound.  ?Genitourinary: ?   General: Normal vulva.  ?   Pubic Area: No rash.   ?   Labia:     ?   Right: No rash, tenderness or lesion.     ?   Left: No rash, tenderness or lesion.   ?   Vagina: Normal. No vaginal discharge, erythema or tenderness.  ?   Cervix: No cervical motion tenderness.  ?   Uterus: Normal. Not enlarged and not tender.   ?   Adnexa: Left adnexa normal.    ?   Right: Tenderness present. No mass.      ?   Left: No mass or tenderness.    ?Musculoskeletal:     ?   General: Normal range of motion.  ?   Cervical back: Normal range of motion.  ?Skin: ?   General: Skin is warm and dry.  ?Neurological:  ?   General: No focal deficit present.  ?   Mental Status: She is alert and oriented to person, place, and time.  ?Psychiatric:     ?   Mood and Affect: Mood normal.     ?   Behavior:  Behavior normal.     ?   Thought Content: Thought content normal.     ?   Judgment: Judgment normal.  ? ? ?Assessment/Plan: ?Pelvic pain--sx much improved, exam much  improved. Reassurance. Fu/ prn.  ? ?RLQ abdominal pain--mild sx, most likely due to RTO follicle/cyst. F/u prn.  ? ? ? ? Return if symptoms worsen or fail to improve. ? ?Mead Slane B. Gerado Nabers, PA-C ?02/12/2022 ?2:55 PM ? ? ? ? ? ?

## 2022-02-17 ENCOUNTER — Other Ambulatory Visit: Payer: Self-pay | Admitting: Family Medicine

## 2022-02-17 DIAGNOSIS — F5101 Primary insomnia: Secondary | ICD-10-CM

## 2022-02-19 NOTE — Telephone Encounter (Signed)
Requested medication (s) are due for refill today: yes ? ?Requested medication (s) are on the active medication list: yes ? ?Last refill:  01/26/22 ? ?Future visit scheduled: no ? ?Notes to clinic:  Unable to refill per protocol, pharmacy requesting 90 days, routing for PCP approval. ? ? ?  ?Requested Prescriptions  ?Pending Prescriptions Disp Refills  ? traZODone (DESYREL) 50 MG tablet [Pharmacy Med Name: TRAZODONE 50 MG TABLET] 90 tablet 2  ?  Sig: TAKE 1/2 TO 1 TABLET BY MOUTH AS NEEDED FOR SLEEP (IF WAKE UP IN NIGHT AND CAN'T SLEEP)  ?  ? Psychiatry: Antidepressants - Serotonin Modulator Passed - 02/17/2022  1:33 PM  ?  ?  Passed - Completed PHQ-2 or PHQ-9 in the last 360 days  ?  ?  Passed - Valid encounter within last 6 months  ?  Recent Outpatient Visits   ? ?      ? 4 months ago Anxiety, generalized  ? [facility] Joe T, FNP  ? 6 months ago Anxiety, generalized  ? [facility] [place], Dionne Bucy, MD  ? 1 year ago Cough  ? [place], [place]  ? 3 years ago Yeast vaginitis  ? [place], [place]  ? 5 years ago Class 3 obesity due to excess calories with serious comorbidity and body mass index (BMI) of 40.0 to 44.9 in adult [facility])  ? [facility], Anderson Malta M, [place]  ? ?  ?  ? ?  ?  ?  ? ? ?

## 2022-02-20 DIAGNOSIS — M531 Cervicobrachial syndrome: Secondary | ICD-10-CM | POA: Diagnosis not present

## 2022-02-20 DIAGNOSIS — M9901 Segmental and somatic dysfunction of cervical region: Secondary | ICD-10-CM | POA: Diagnosis not present

## 2022-02-20 DIAGNOSIS — M4602 Spinal enthesopathy, cervical region: Secondary | ICD-10-CM | POA: Diagnosis not present

## 2022-02-20 DIAGNOSIS — M62830 Muscle spasm of back: Secondary | ICD-10-CM | POA: Diagnosis not present

## 2022-02-20 NOTE — Telephone Encounter (Signed)
LOV 09-23-21 ?NOV none  ?LR 01-26-22 #30 with 3 r/fs  ?

## 2022-03-14 ENCOUNTER — Other Ambulatory Visit: Payer: Self-pay | Admitting: Family Medicine

## 2022-03-14 DIAGNOSIS — F411 Generalized anxiety disorder: Secondary | ICD-10-CM

## 2022-03-15 NOTE — Telephone Encounter (Signed)
Left message on vm advising to call for an appt.  ?

## 2022-04-17 ENCOUNTER — Other Ambulatory Visit: Payer: Self-pay | Admitting: Obstetrics and Gynecology

## 2022-04-17 DIAGNOSIS — Z1231 Encounter for screening mammogram for malignant neoplasm of breast: Secondary | ICD-10-CM

## 2022-04-19 ENCOUNTER — Ambulatory Visit
Admission: RE | Admit: 2022-04-19 | Discharge: 2022-04-19 | Disposition: A | Discharge status: Home / Self Care | Payer: BC Managed Care – PPO | Source: Ambulatory Visit | Attending: Obstetrics and Gynecology | Admitting: Obstetrics and Gynecology

## 2022-04-19 DIAGNOSIS — Z1231 Encounter for screening mammogram for malignant neoplasm of breast: Secondary | ICD-10-CM | POA: Insufficient documentation
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
## 2022-04-25 ENCOUNTER — Encounter: Payer: Self-pay | Admitting: Family Medicine

## 2022-04-25 ENCOUNTER — Ambulatory Visit: Payer: BC Managed Care – PPO | Admitting: Family Medicine

## 2022-04-25 ENCOUNTER — Telehealth: Payer: Self-pay | Admitting: *Deleted

## 2022-04-25 VITALS — BP 151/98 | HR 78 | Temp 97.5°F | Resp 16 | Ht 63.0 in | Wt 211.3 lb

## 2022-04-25 DIAGNOSIS — F321 Major depressive disorder, single episode, moderate: Secondary | ICD-10-CM | POA: Diagnosis not present

## 2022-04-25 DIAGNOSIS — R030 Elevated blood-pressure reading, without diagnosis of hypertension: Secondary | ICD-10-CM | POA: Diagnosis not present

## 2022-04-25 DIAGNOSIS — F411 Generalized anxiety disorder: Secondary | ICD-10-CM

## 2022-04-25 MED ORDER — BUPROPION HCL ER (SR) 150 MG PO TB12: 150.0000 mg | ORAL_TABLET | Freq: Two times a day (BID) | ORAL | 1 refills | Status: DC

## 2022-04-25 MED ORDER — PAROXETINE HCL 20 MG PO TABS: 20.0000 mg | ORAL_TABLET | Freq: Every evening | ORAL | 3 refills | Status: DC
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
## 2022-04-25 NOTE — Chronic Care Management (AMB) (Signed)
  Care Management   Note  04/25/2022 Name: Carrie Stokes MRN: [id number] DOB: 05-19-1976  Carrie Stokes is a 46 y.o. year old female who is a primary care patient of [name], FNP. I reached out to [name] by phone today offer care coordination services.   [name] was given information about care management services today including:  Care management services include personalized support from designated clinical staff supervised by her physician, including individualized plan of care and coordination with other care providers 24/7 contact phone numbers for assistance for urgent and routine care needs. The patient may stop care management services at any time by phone call to the office staff.  Patient agreed to services and verbal consent obtained.   Follow up plan: Telephone appointment with care management team member scheduled for: 05/01/2022  Julian Hy, [place] Management  Direct Dial: [phone]

## 2022-04-25 NOTE — Assessment & Plan Note (Signed)
Acute, recommend repeat BP check at 6-8 week follow up If BP remains >140/>90 Recommend start of BP medication and or diet/exercise program at that time Patient is under a lot of stressors at this time, likely driving up BP Seek emergent care if necessary
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
## 2022-05-01 ENCOUNTER — Ambulatory Visit: Payer: BC Managed Care – PPO | Admitting: *Deleted

## 2022-05-01 DIAGNOSIS — F321 Major depressive disorder, single episode, moderate: Secondary | ICD-10-CM

## 2022-05-01 DIAGNOSIS — F411 Generalized anxiety disorder: Secondary | ICD-10-CM

## 2022-05-01 NOTE — Patient Instructions (Signed)
Visit Information  Thank you for taking time to visit with me today. Please don't hesitate to contact me if I can be of assistance to you before our next scheduled telephone appointment.  Following are the goals we discussed today:   - follow-up on any referrals for help I am given - have a back-up plan - make a list of family or friends that I can call  -complete Advanced Directive once received , have in notarized and uploaded to your medical record  Our next appointment is by telephone on 05/15/22 at 1pm  Please call the care guide team at [phone] if you need to cancel or reschedule your appointment.   If you are experiencing a Mental Health or [place] or need someone to talk to, please call the Suicide and Crisis Lifeline: 988   Following is a copy of your full plan of care:  Care Plan : General Social Work (Adult)  Updates made by [employer], Darla Lesches, LCSW since 05/01/2022 12:00 AM     Problem: CHL AMB "PATIENT-SPECIFIC PROBLEM"   Note:   CARE PLAN ENTRY (see longitudinal plan of care for additional care plan information)  Current Barriers:  Patient with Anxiety and Depression in need of assistance with connection to community resources related to completing her Advanced Directive-currently has a Social worker and has been referred to psychiatry Knowledge deficits and need for support, education and care coordination related to community resources support  Mental Health Concerns   Clinical Goal(s)  Over the next 90 days, patient will follow up with this social worker in regards to completion of Advanced Directive  Interventions provided by LCSW:  Assessed patient's care coordination needs related to need to complete an Advanced Directive and discussed ongoing care management follow up  Patient confirmed being estranged from her spouse and is in consultation with an attorney with regard to the upcoming divorce proceedings Patient confirmed having a positive  support system and is working with a therapist-patient also referred to Psychiatry Provided patient with information about completing her Advanced Directive-forms will be mailed to her home today Advised patient to complete the Advanced Directive once received, discussed need to have it notarized and uploaded to her chart Collaborated with appropriate clinical care team members regarding patient needs Depression screen reviewed  Solution-Focused Strategies employed:  Active listening / Reflection utilized  Emotional Support Provided   Patient Self Care Activities & Deficits:  Patient is unable to independently navigate community resource options without care coordination support  Acknowledges deficits and is motivated to resolve concern  Patient is able to   as discussed today Needs to completed Advanced Directive Performs ADL's independently Performs IADL's independently Strong family or social support  Initial goal documentation       Ms. Stonehocker was given information about Care Management services by the embedded care coordination team including:  Care Management services include personalized support from designated clinical staff supervised by her physician, including individualized plan of care and coordination with other care providers 24/7 contact phone numbers for assistance for urgent and routine care needs. The patient may stop CCM services at any time (effective at the end of the month) by phone call to the office staff.  Patient agreed to services and verbal consent obtained.   Patient verbalizes understanding of instructions and care plan provided today and agrees to view in [place]. Active MyChart status and patient understanding of how to access instructions and care plan via MyChart confirmed with patient.  Telephone follow up appointment with care management team member scheduled for:05/15/22   Elliot Gurney, [place] Worker  [facility] Care Management [phone]

## 2022-05-01 NOTE — Chronic Care Management (AMB) (Signed)
Care Management Clinical Social Work Note  05/01/2022 Name: Carrie Stokes MRN: [id number] DOB: 05-May-1976  Carrie Stokes is a 46 y.o. year old female who is a primary care patient of Carrie Sprout, FNP.  The Care Management team was consulted for assistance with chronic disease management and coordination needs.  Engaged with patient by telephone for follow up visit in response to provider referral for social work chronic care management and care coordination services  Consent to Services:  Carrie Stokes was given information about Care Management services today including:  Care Management services includes personalized support from designated clinical staff supervised by her physician, including individualized plan of care and coordination with other care providers 24/7 contact phone numbers for assistance for urgent and routine care needs. The patient may stop case management services at any time by phone call to the office staff.  Patient agreed to services and consent obtained.   Assessment: Review of patient past medical history, allergies, medications, and health status, including review of relevant consultants reports was performed today as part of a comprehensive evaluation and provision of chronic care management and care coordination services.  SDOH (Social Determinants of Health) assessments and interventions performed:  SDOH Interventions    Flowsheet Row Most Recent Value  SDOH Interventions   SDOH Interventions for the Following Domains Depression  Depression Interventions/Treatment  Currently on Treatment        Advanced Directives Status: Not addressed in this encounter.  Care Plan  No Known Allergies  Outpatient Encounter Medications as of 05/01/2022  Medication Sig Note   buPROPion (WELLBUTRIN SR) 150 MG 12 hr tablet Take 1 tablet (150 mg total) by mouth 2 (two) times daily.    Cholecalciferol (VITAMIN D3 PO) Take by mouth.    ferrous gluconate  (FERGON) 240 (27 FE) MG tablet TAKE 1 TABLET BY MOUTH 2 TIMES A DAY    levonorgestrel (MIRENA, 52 MG,) 20 MCG/24HR IUD by Intrauterine route once for 1 dose.    Multiple Vitamin (MULTIVITAMINS PO) Take by mouth. 10/31/2015: Received from: [facility]   PARoxetine (PAXIL) 20 MG tablet Take 1 tablet (20 mg total) by mouth at bedtime.    traZODone (DESYREL) 100 MG tablet TAKE 1 TABLET BY MOUTH AT BEDTIME AS NEEDED    traZODone (DESYREL) 50 MG tablet TAKE 1/2 TO 1 TABLET BY MOUTH AS NEEDED FOR SLEEP (IF WAKE UP IN NIGHT AND CAN'T SLEEP)    No facility-administered encounter medications on file as of 05/01/2022.    Patient Active Problem List   Diagnosis Date Noted   Depression, major, single episode, moderate ([place]) 04/25/2022   Elevated blood pressure reading without diagnosis of hypertension 04/25/2022   Cervical high risk human papillomavirus (HPV) DNA test positive 08/22/2021   BV (bacterial vaginosis) 08/16/2021   Anxiety, generalized 10/31/2015   Obesity 10/31/2015   Genital herpes 11/23/2004    Conditions to be addressed/monitored: Anxiety and Depression; Lacks knowledge of community resource: related to completion of her Advanced Directives  Care Plan : General Social Work (Adult)  Updates made by [employer], Carrie Stokes, Carrie Stokes since 05/01/2022 12:00 AM     Problem: CHL AMB "PATIENT-SPECIFIC PROBLEM"   Note:   CARE PLAN ENTRY (see longitudinal plan of care for additional care plan information)  Current Barriers:  Patient with Anxiety and Depression in need of assistance with connection to community resources related to completing her Advanced Directive-currently has a Social worker and has been referred to psychiatry Knowledge deficits and need for  support, education and care coordination related to community resources support  Mental Health Concerns   Clinical Goal(s)  Over the next 90 days, patient will follow up with this social worker in regards to completion of  Advanced Directive  Interventions provided by Carrie Stokes:  Assessed patient's care coordination needs related to need to complete an Advanced Directive and discussed ongoing care management follow up  Patient confirmed being estranged from her spouse and is in consultation with an attorney with regard to the upcoming divorce proceedings Patient confirmed having a positive support system and is working with a therapist-patient also referred to Psychiatry Provided patient with information about completing her Advanced Directive-forms will be mailed to her home today Advised patient to complete the Advanced Directive once received, discussed need to have it notarized and uploaded to her chart Collaborated with appropriate clinical care team members regarding patient needs Depression screen reviewed  Solution-Focused Strategies employed:  Active listening / Reflection utilized  Emotional Support Provided   Patient Self Care Activities & Deficits:  Patient is unable to independently navigate community resource options without care coordination support  Acknowledges deficits and is motivated to resolve concern  Patient is able to   as discussed today Needs to completed Advanced Directive Performs ADL's independently Performs IADL's independently Strong family or social support  Initial goal documentation        Follow Up Plan: SW will follow up with patient by phone over the next 14 business days   [employer], [place] Worker  [place] Care Management [phone]

## 2022-05-10 ENCOUNTER — Telehealth: Payer: Self-pay | Admitting: *Deleted

## 2022-05-10 NOTE — Telephone Encounter (Signed)
  Care Management   Follow Up Note   05/10/2022 Name: Carrie Stokes MRN: [id number] DOB: Sep 30, 1976   Referred by: [name], FNP Reason for referral : Care Coordination   An unsuccessful telephone outreach was attempted today. The patient was referred to the case management team for assistance with care management and care coordination.   Follow Up Plan: Telephone follow up appointment with care management team member scheduled for: 05/15/22  .cls

## 2022-05-15 ENCOUNTER — Ambulatory Visit: Payer: BC Managed Care – PPO | Admitting: *Deleted

## 2022-05-15 DIAGNOSIS — F411 Generalized anxiety disorder: Secondary | ICD-10-CM

## 2022-05-15 DIAGNOSIS — F321 Major depressive disorder, single episode, moderate: Secondary | ICD-10-CM

## 2022-06-07 ENCOUNTER — Ambulatory Visit: Payer: BC Managed Care – PPO | Admitting: Family Medicine

## 2022-06-12 ENCOUNTER — Encounter: Payer: Self-pay | Admitting: Podiatry

## 2022-06-12 ENCOUNTER — Ambulatory Visit: Payer: BC Managed Care – PPO | Admitting: Podiatry

## 2022-06-12 DIAGNOSIS — R601 Generalized edema: Secondary | ICD-10-CM | POA: Diagnosis not present

## 2022-06-12 NOTE — Progress Notes (Signed)
Subjective:  Patient ID: Carrie Stokes, female    DOB: 08-12-1976,  MRN: [id number]  Chief Complaint  Patient presents with   Callouses    46 y.o. female presents with the above complaint.  Patient presents with swelling of the left leg.  Patient states that she started noticing more more swelling she has tried over-the-counter compression socks now which has helped.  She would like to know if she can get Saxenda can be measured 4.  She has not seen MRIs prior to seeing me she denies any other acute complaints.  No pain nose ulceration.   Review of Systems: Negative except as noted in the HPI. Denies N/V/F/Ch.  Past Medical History:  Diagnosis Date   Anemia    Anxiety    Major depression     Current Outpatient Medications:    ALPRAZolam (XANAX) 0.5 MG tablet, TAKE 1 TABLET BY MOUTH TWICE A DAY AS NEEDED FOR ANXIETY, Disp: , Rfl:    buPROPion (WELLBUTRIN SR) 150 MG 12 hr tablet, Take 1 tablet (150 mg total) by mouth 2 (two) times daily., Disp: 180 tablet, Rfl: 1   Cholecalciferol (VITAMIN D3 PO), Take by mouth., Disp: , Rfl:    diclofenac (VOLTAREN) 50 MG EC tablet, , Disp: , Rfl:    doxycycline (VIBRAMYCIN) 100 MG capsule, , Disp: , Rfl:    ferrous gluconate (FERGON) 240 (27 FE) MG tablet, TAKE 1 TABLET BY MOUTH 2 TIMES A DAY, Disp: 100 tablet, Rfl: 5   fluconazole (DIFLUCAN) 150 MG tablet, TAKE 1 TABLET BY MOUTH ON DAY 1. TAKE SECOND TAB 3 DAYS LATER IF STILL SYMPTOMATIC, Disp: , Rfl:    levonorgestrel (MIRENA, 52 MG,) 20 MCG/24HR IUD, by Intrauterine route once for 1 dose., Disp: 1 each, Rfl: 0   meloxicam (MOBIC) 15 MG tablet, Take 1 tablet by mouth daily., Disp: , Rfl:    Multiple Vitamin (MULTIVITAMINS PO), Take by mouth., Disp: , Rfl:    PARoxetine (PAXIL) 20 MG tablet, Take 1 tablet (20 mg total) by mouth at bedtime., Disp: 90 tablet, Rfl: 3   traZODone (DESYREL) 100 MG tablet, TAKE 1 TABLET BY MOUTH AT BEDTIME AS NEEDED, Disp: 90 tablet, Rfl: 1   traZODone (DESYREL)  50 MG tablet, TAKE 1/2 TO 1 TABLET BY MOUTH AS NEEDED FOR SLEEP (IF WAKE UP IN NIGHT AND CAN'T SLEEP), Disp: 90 tablet, Rfl: 2  Social History   Tobacco Use  Smoking Status Never  Smokeless Tobacco Never    No Known Allergies Objective:  There were no vitals filed for this visit. There is no height or weight on file to calculate BMI. Constitutional Well developed. Well nourished.  Vascular Dorsalis pedis pulses palpable bilaterally. Posterior tibial pulses palpable bilaterally. Capillary refill normal to all digits.  No cyanosis or clubbing noted. Pedal hair growth normal.  Neurologic Normal speech. Oriented to person, place, and time. Epicritic sensation to light touch grossly present bilaterally.  Dermatologic Generalized left lower extremity edema noted without calf pain.  It is not pitting edema.  No ulceration noted no erythema noted.  No signs of infection noted.  No signs of DVT noted  Orthopedic: Normal joint ROM without pain or crepitus bilaterally. No visible deformities. No bony tenderness.   Radiographs: None Assessment:   1. Generalized edema    Plan:  Patient was evaluated and treated and all questions answered.  Left leg edema/swelling -All questions and concerns were discussed with the patient in extensive detail.  I discussed with her the  importance of compression socks and elevation.  She states understanding would like to proceed with that. -Ultimately I discussed with her she will benefit from compression socks a prescription was given for compression socks.  No follow-ups on file.

## 2022-06-21 ENCOUNTER — Ambulatory Visit: Payer: BC Managed Care – PPO | Admitting: Family Medicine

## 2022-07-24 DIAGNOSIS — D225 Melanocytic nevi of trunk: Secondary | ICD-10-CM | POA: Diagnosis not present

## 2022-07-24 DIAGNOSIS — M67472 Ganglion, left ankle and foot: Secondary | ICD-10-CM | POA: Diagnosis not present

## 2022-07-24 DIAGNOSIS — D2362 Other benign neoplasm of skin of left upper limb, including shoulder: Secondary | ICD-10-CM | POA: Diagnosis not present

## 2022-07-24 DIAGNOSIS — L578 Other skin changes due to chronic exposure to nonionizing radiation: Secondary | ICD-10-CM | POA: Diagnosis not present

## 2022-08-16 ENCOUNTER — Encounter: Payer: Self-pay | Admitting: Obstetrics and Gynecology

## 2022-10-04 IMAGING — MG MM DIGITAL SCREENING BILAT W/ TOMO AND CAD
8 series · 8 of 24 positions shown · non-contrast
Comparison: Previous exam(s).

CLINICAL DATA: Screening.

EXAM:
DIGITAL SCREENING BILATERAL MAMMOGRAM WITH TOMOSYNTHESIS AND CAD
TECHNIQUE: Bilateral screening digital craniocaudal and mediolateral oblique
mammograms were obtained. Bilateral screening digital breast
tomosynthesis was performed. The images were evaluated with
computer-aided detection.

[R MLO synth-2D]
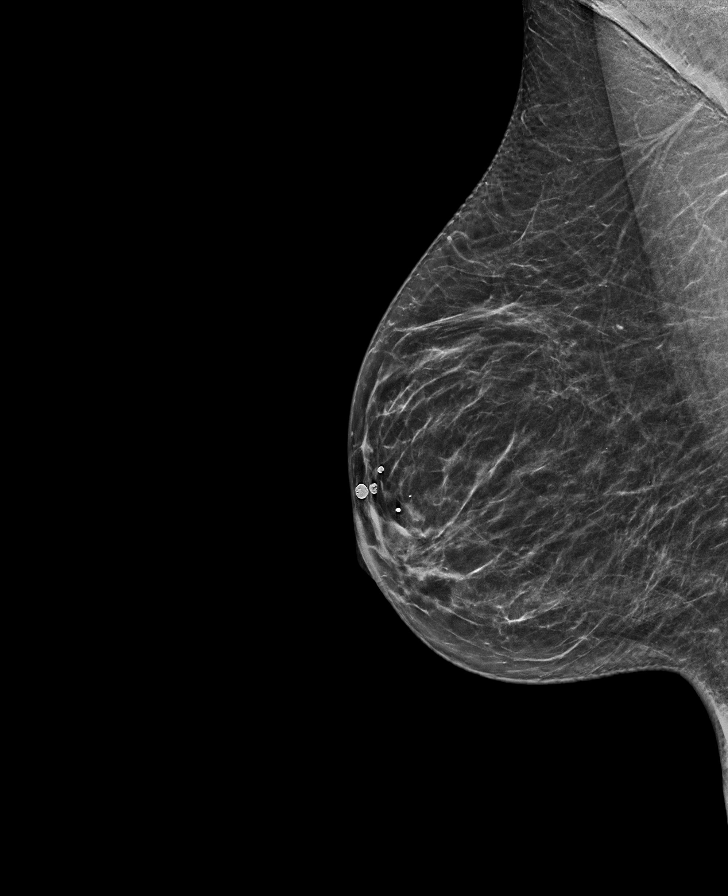

[R CC synth-2D]
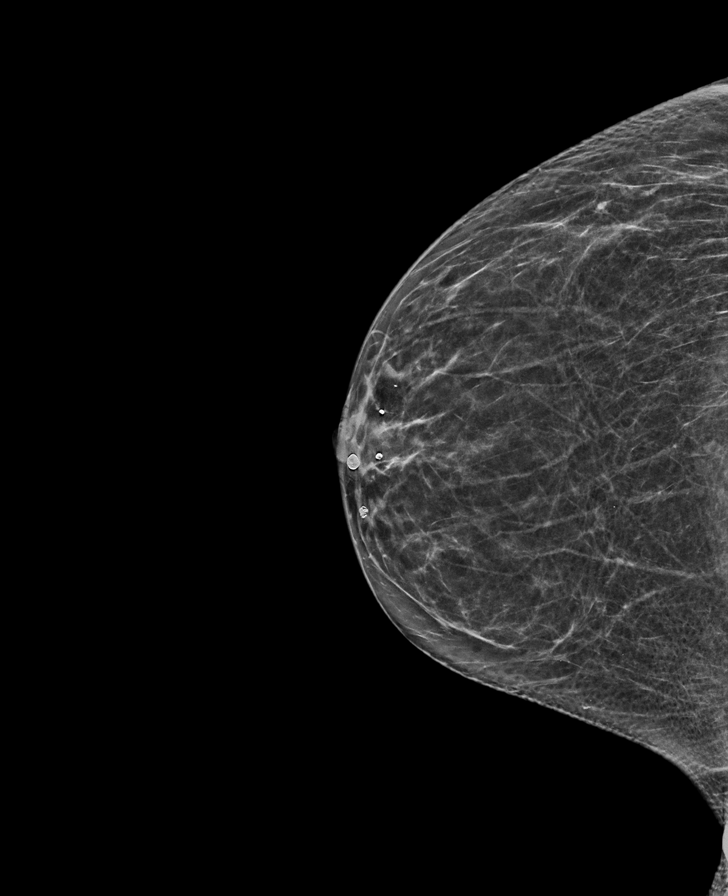

[L MLO synth-2D]
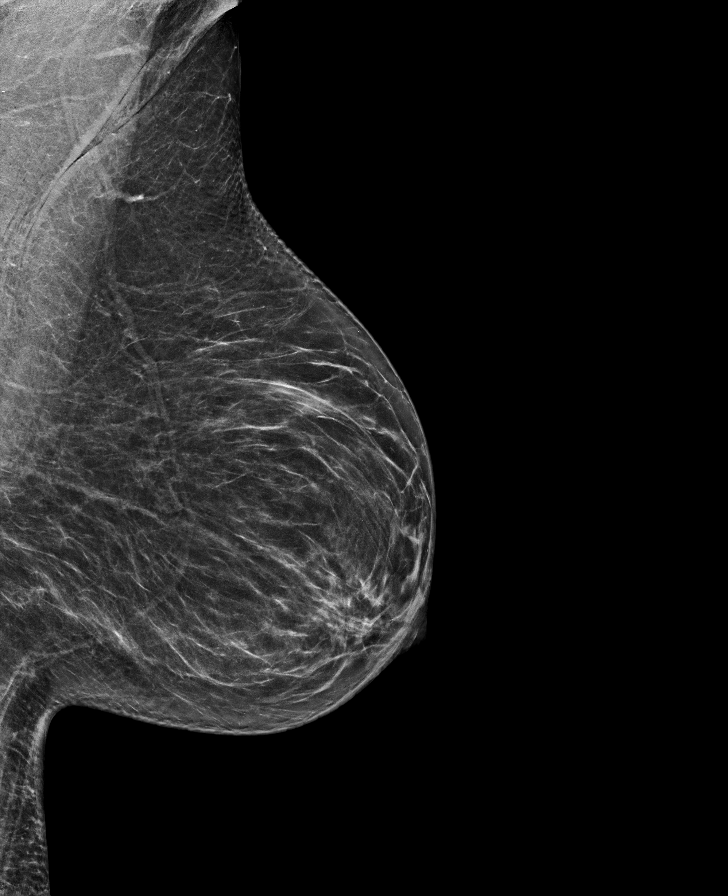

[L CC synth-2D]
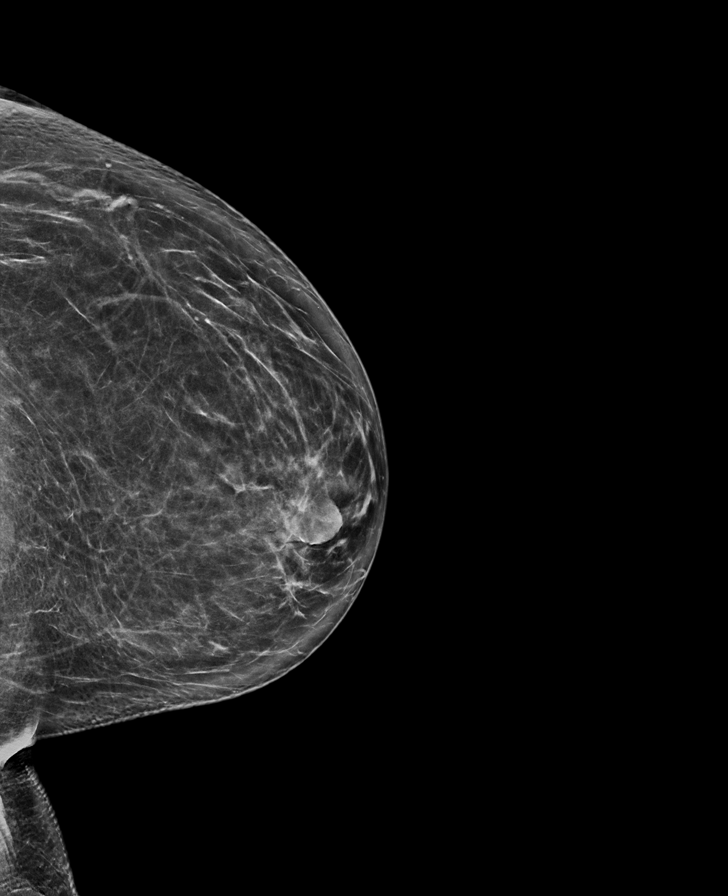

[R CC tomo · tomo slice 31/61.0]
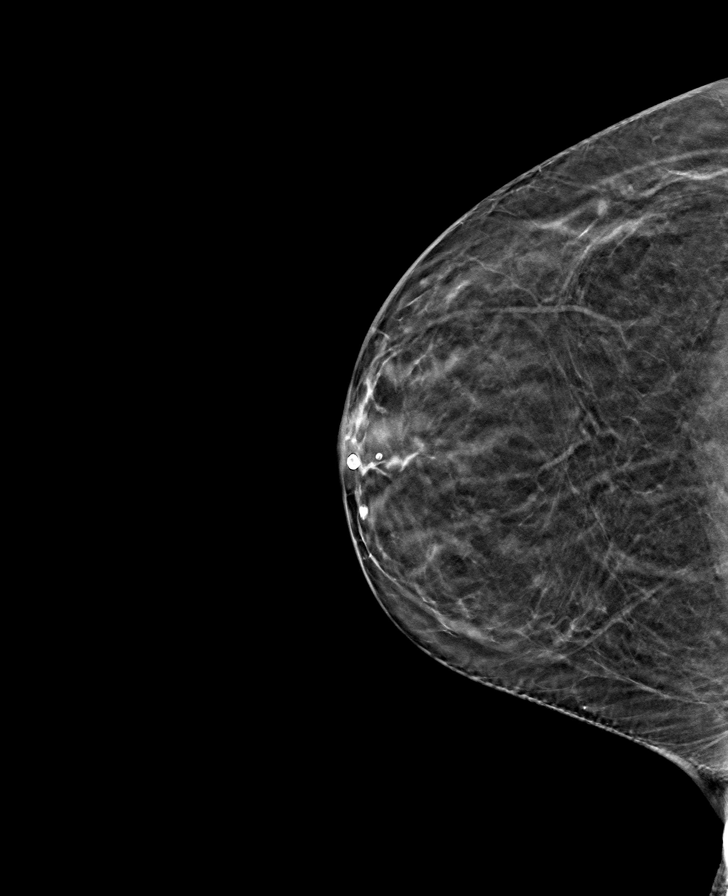

[L CC tomo · tomo slice 36/71.0]
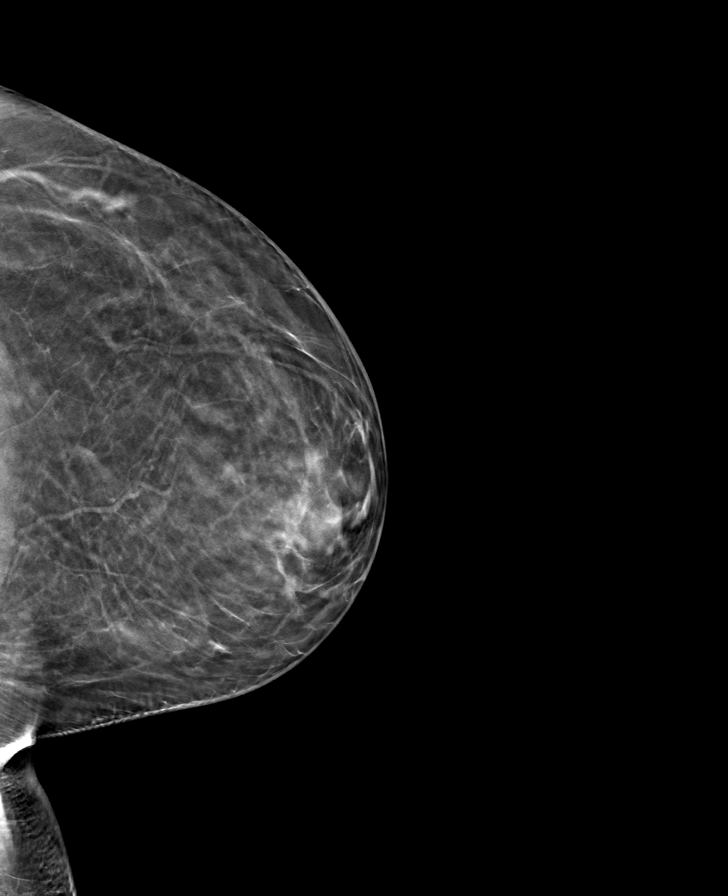

[L MLO tomo · tomo slice 36/71.0]
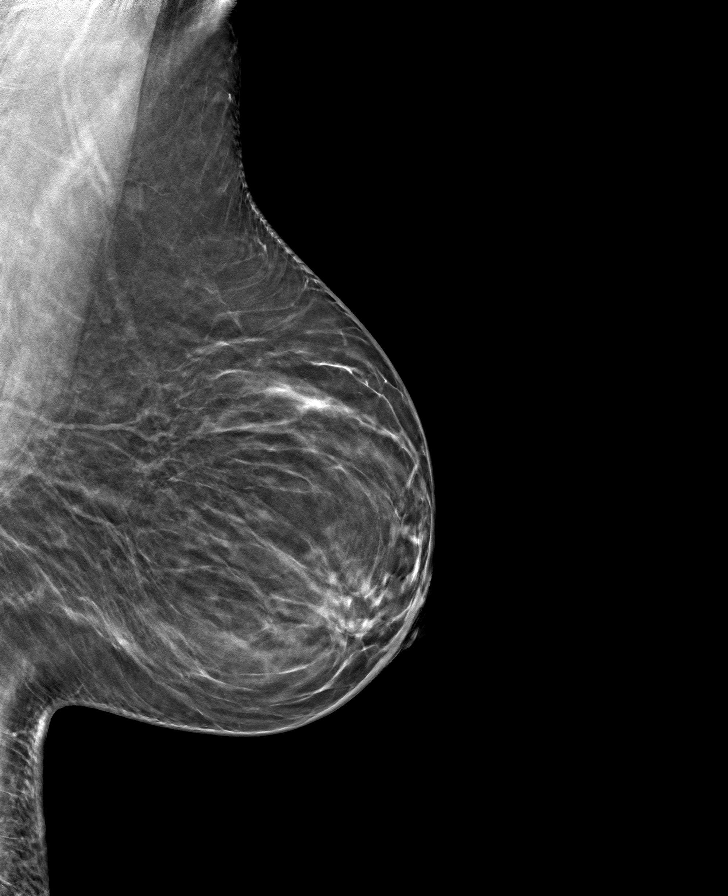

[R MLO tomo · tomo slice 33/66.0]
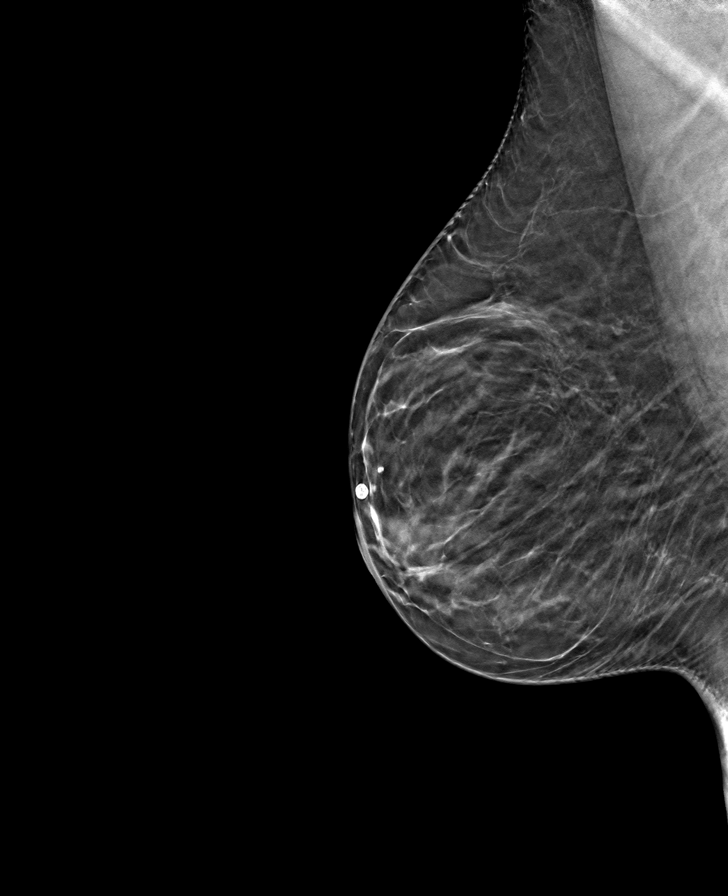

[8 of 24 positions shown; findings below may reference images not displayed]

ACR Breast Density Category b: There are scattered areas of
fibroglandular density.
FINDINGS: There are no findings suspicious for malignancy.
IMPRESSION: No mammographic evidence of malignancy. A result letter of this
screening mammogram will be mailed directly to the patient.

RECOMMENDATION:
Screening mammogram in one year. (Code:51-O-LD2)

BI-RADS CATEGORY  1: Negative.

## 2022-10-12 ENCOUNTER — Other Ambulatory Visit: Payer: Self-pay | Admitting: Family Medicine

## 2022-10-12 DIAGNOSIS — F5101 Primary insomnia: Secondary | ICD-10-CM

## 2022-10-15 NOTE — Telephone Encounter (Signed)
Requested Prescriptions  Pending Prescriptions Disp Refills   traZODone (DESYREL) 50 MG tablet [Pharmacy Med Name: TRAZODONE 50 MG TABLET] 90 tablet 0    Sig: TAKE 1/2 TO 1 TABLET BY MOUTH AS NEEDED FOR SLEEP (IF WAKE UP IN NIGHT AND CAN'T SLEEP)     Psychiatry: Antidepressants - Serotonin Modulator Passed - 10/12/2022  9:20 AM      Passed - Completed PHQ-2 or PHQ-9 in the last 360 days      Passed - Valid encounter within last 6 months    Recent Outpatient Visits           5 months ago Anxiety, generalized   [facility] [name], FNP   1 year ago Anxiety, generalized   [facility] [name], FNP   1 year ago Anxiety, generalized   [facility], Dionne Bucy, [place]   2 years ago [place], [place], [place]   4 years ago Yeast vaginitis   [facility] [place], [place], [place]

## 2023-01-01 LAB — LIPID PANEL
Cholesterol: 178 (ref 0–200)
HDL: 89 — AB (ref 35–70)
LDL Cholesterol: 75
LDl/HDL Ratio: 2
Triglycerides: 77 (ref 40–160)

## 2023-01-01 LAB — HEPATIC FUNCTION PANEL
ALT: 26 U/L (ref 7–35)
AST: 24 (ref 13–35)
Alkaline Phosphatase: 69 (ref 25–125)
Bilirubin, Total: 0.1

## 2023-01-01 LAB — BASIC METABOLIC PANEL
BUN: 11 (ref 4–21)
Glucose: 87

## 2023-01-01 LAB — COMPREHENSIVE METABOLIC PANEL
Albumin: 4.2 (ref 3.5–5.0)
Calcium: 9.7 (ref 8.7–10.7)
Globulin: 2.2

## 2023-01-01 LAB — TSH: TSH: 3.49 (ref 0.41–5.90)

## 2023-01-08 ENCOUNTER — Encounter: Payer: Self-pay | Admitting: Family Medicine

## 2023-01-09 NOTE — Telephone Encounter (Signed)
Attempted to reach patient regarding scheduling an appt, LMTCB.

## 2023-04-02 ENCOUNTER — Other Ambulatory Visit: Payer: Self-pay | Admitting: Obstetrics and Gynecology

## 2023-04-02 DIAGNOSIS — Z1231 Encounter for screening mammogram for malignant neoplasm of breast: Secondary | ICD-10-CM

## 2023-04-22 ENCOUNTER — Ambulatory Visit
Admission: RE | Admit: 2023-04-22 | Discharge: 2023-04-22 | Disposition: A | Discharge status: Home / Self Care | Payer: 59 | Source: Ambulatory Visit | Attending: Obstetrics and Gynecology | Admitting: Obstetrics and Gynecology

## 2023-04-22 DIAGNOSIS — Z1231 Encounter for screening mammogram for malignant neoplasm of breast: Secondary | ICD-10-CM

## 2023-05-15 ENCOUNTER — Other Ambulatory Visit: Payer: Self-pay | Admitting: Family Medicine

## 2023-05-15 DIAGNOSIS — F321 Major depressive disorder, single episode, moderate: Secondary | ICD-10-CM

## 2023-05-15 DIAGNOSIS — F411 Generalized anxiety disorder: Secondary | ICD-10-CM

## 2023-05-15 NOTE — Telephone Encounter (Signed)
Needs appt for further refills.

## 2023-05-16 NOTE — Telephone Encounter (Signed)
Patient needs OV for additional refills, will refill for 30 days until OV can be made.  Requested Prescriptions  Pending Prescriptions Disp Refills   PARoxetine (PAXIL) 20 MG tablet [Pharmacy Med Name: PAROXETINE HCL 20 MG TABLET] 30 tablet 0    Sig: TAKE 1 TABLET BY MOUTH EVERYDAY AT BEDTIME     Psychiatry:  Antidepressants - SSRI Failed - 05/15/2023  2:19 AM      Failed - Completed PHQ-2 or PHQ-9 in the last 360 days      Failed - Valid encounter within last 6 months    Recent Outpatient Visits           1 year ago Anxiety, generalized   [facility] Jacky Kindle, FNP   1 year ago Anxiety, generalized   [facility] Jacky Kindle, FNP   1 year ago Anxiety, generalized   [facility] [place], Marzella Schlein, MD   2 years ago Cough   [facility] Osvaldo Angst M, [place]   4 years ago Yeast vaginitis   [facility] [place], [place], [place]

## 2023-06-10 ENCOUNTER — Other Ambulatory Visit: Payer: Self-pay | Admitting: Family Medicine

## 2023-06-10 DIAGNOSIS — F411 Generalized anxiety disorder: Secondary | ICD-10-CM

## 2023-06-10 DIAGNOSIS — F321 Major depressive disorder, single episode, moderate: Secondary | ICD-10-CM

## 2023-06-11 NOTE — Telephone Encounter (Signed)
Requested Prescriptions  Pending Prescriptions Disp Refills   PARoxetine (PAXIL) 20 MG tablet [Pharmacy Med Name: PAROXETINE HCL 20 MG TABLET] 90 tablet 0    Sig: TAKE 1 TABLET BY MOUTH EVERYDAY AT BEDTIME     Psychiatry:  Antidepressants - SSRI Failed - 06/10/2023  2:31 PM      Failed - Completed PHQ-2 or PHQ-9 in the last 360 days      Failed - Valid encounter within last 6 months    Recent Outpatient Visits           1 year ago Anxiety, generalized   [facility] Jacky Kindle, FNP   1 year ago Anxiety, generalized   [facility] Jacky Kindle, FNP   1 year ago Anxiety, generalized   [facility] [place], Marzella Schlein, MD   2 years ago Cough   [facility] Osvaldo Angst M, [place]   4 years ago Yeast vaginitis   [facility] [place], Alessandra [place], [place]       Future Appointments             In 1 month Suzie Portela, Daryl Eastern, FNP [facility]

## 2023-06-25 ENCOUNTER — Other Ambulatory Visit: Payer: Self-pay | Admitting: Family Medicine

## 2023-06-25 ENCOUNTER — Encounter: Payer: Self-pay | Admitting: Family Medicine

## 2023-06-25 MED ORDER — NIRMATRELVIR/RITONAVIR (PAXLOVID) TABLET (RENAL DOSING): 2.0000 | ORAL_TABLET | Freq: Two times a day (BID) | ORAL | 0 refills | Status: AC

## 2023-07-11 ENCOUNTER — Ambulatory Visit (INDEPENDENT_AMBULATORY_CARE_PROVIDER_SITE_OTHER): Payer: No Typology Code available for payment source | Admitting: Family Medicine

## 2023-07-11 ENCOUNTER — Encounter: Payer: Self-pay | Admitting: Family Medicine

## 2023-07-11 VITALS — BP 133/96 | HR 80 | Ht 63.0 in | Wt 202.8 lb

## 2023-07-11 DIAGNOSIS — F411 Generalized anxiety disorder: Secondary | ICD-10-CM

## 2023-07-11 DIAGNOSIS — I10 Essential (primary) hypertension: Secondary | ICD-10-CM

## 2023-07-11 DIAGNOSIS — F321 Major depressive disorder, single episode, moderate: Secondary | ICD-10-CM

## 2023-07-11 DIAGNOSIS — F39 Unspecified mood [affective] disorder: Secondary | ICD-10-CM | POA: Insufficient documentation

## 2023-07-11 DIAGNOSIS — Z0000 Encounter for general adult medical examination without abnormal findings: Secondary | ICD-10-CM

## 2023-07-11 DIAGNOSIS — F5101 Primary insomnia: Secondary | ICD-10-CM | POA: Diagnosis not present

## 2023-07-11 MED ORDER — CLONAZEPAM 0.5 MG PO TABS
0.5000 mg | ORAL_TABLET | Freq: Every day | ORAL | 5 refills | Status: DC | PRN
Start: 2023-07-11 — End: 2024-02-21

## 2023-07-11 MED ORDER — PAROXETINE HCL 40 MG PO TABS
40.0000 mg | ORAL_TABLET | Freq: Every day | ORAL | 3 refills | Status: DC
Start: 2023-07-11 — End: 2024-07-02

## 2023-07-11 MED ORDER — LISINOPRIL-HYDROCHLOROTHIAZIDE 20-25 MG PO TABS: 1.0000 | ORAL_TABLET | Freq: Every day | ORAL | 3 refills | Status: DC

## 2023-07-11 NOTE — Assessment & Plan Note (Signed)

## 2023-07-11 NOTE — Assessment & Plan Note (Addendum)
Chronic, remains elevated Goal 139/89 or less Recommend trial of Zestoretic to assist

## 2023-07-11 NOTE — Assessment & Plan Note (Signed)
Chronic, uncontrolled Continue 100-150 mg as needed/night

## 2023-07-11 NOTE — Assessment & Plan Note (Signed)
Chronic, worsening Increase paxil from 20 to 40 to assist

## 2023-07-11 NOTE — Progress Notes (Signed)
Complete physical exam   Patient: Carrie Stokes   DOB: Feb 18, 1976   47 y.o. Female  MRN: [id number] Visit Date: 07/11/2023  Today's healthcare provider: Jacky Kindle, FNP  Re Introduced to nurse practitioner role and practice setting.  All questions answered.  Discussed provider/patient relationship and expectations.  Chief Complaint  Patient presents with   Annual Exam   Subjective    Carrie Stokes is a 47 y.o. female who presents today for a complete physical exam.  She reports consuming a general diet. Home exercise routine includes 30-60 mins online vidoe. She generally feels fairly well. She reports sleeping fairly well. She does have additional problems to discuss today.  HPI   Past Medical History:  Diagnosis Date   Anemia    Anxiety    Major depression    Past Surgical History:  Procedure Laterality Date   APPENDECTOMY  2005   CHOLECYSTECTOMY  2005   Dr. Evette Cristal   COLONOSCOPY WITH PROPOFOL N/A 09/20/2021   Procedure: COLONOSCOPY WITH PROPOFOL;  Surgeon: Pasty Spillers, MD;  Location: [facility] ENDOSCOPY;  Service: Endoscopy;  Laterality: N/A;   GASTRIC BYPASS     KNEE ARTHROSCOPY W/ ACL RECONSTRUCTION Right 07/13/2015   Dr. Wyline Mood (miniscus)   Social History   Socioeconomic History   Marital status: Married    Spouse name: Not on file   Number of children: Not on file   Years of education: Not on file   Highest education level: Not on file  Occupational History   Not on file  Tobacco Use   Smoking status: Never   Smokeless tobacco: Never  Vaping Use   Vaping status: Never Used  Substance and Sexual Activity   Alcohol use: Yes    Comment: occasional   Drug use: No   Sexual activity: Yes    Birth control/protection: I.U.D.    Comment: Mirena  Other Topics Concern   Not on file  Social History Narrative   Not on file   Social Determinants of Health   Financial Resource Strain: Medium Risk (05/01/2022)   Overall Financial  Resource Strain (CARDIA)    Difficulty of Paying Living Expenses: Somewhat hard  Food Insecurity: No Food Insecurity (05/01/2022)   Hunger Vital Sign    Worried About Running Out of Food in the Last Year: Never true    Ran Out of Food in the Last Year: Never true  Transportation Needs: No Transportation Needs (05/01/2022)   PRAPARE - Administrator, Civil Service (Medical): No    Lack of Transportation (Non-Medical): No  Physical Activity: Not on file  Stress: Not on file  Social Connections: Not on file  Intimate Partner Violence: Not on file   Family Status  Relation Name Status   Mother  Alive   Father UNKNOWN Other   Mat Aunt  Alive   MGM  Deceased   Other mat grt aunt Deceased  No partnership data on file   Family History  Problem Relation Age of Onset   Asthma Mother    Colon cancer Maternal Aunt 60       stage 4, mtes to lung and liver   Lung cancer Maternal Aunt    Lung cancer Maternal Grandmother    Breast cancer Other    No Known Allergies  Patient Care Team: Jacky Kindle, FNP as PCP - General (Family Medicine)   Medications: Outpatient Medications Prior to Visit  Medication Sig   [DISCONTINUED] PARoxetine (  PAXIL) 20 MG tablet TAKE 1 TABLET BY MOUTH EVERYDAY AT BEDTIME   levonorgestrel (MIRENA, 52 MG,) 20 MCG/24HR IUD by Intrauterine route once for 1 dose.   [DISCONTINUED] ALPRAZolam (XANAX) 0.5 MG tablet TAKE 1 TABLET BY MOUTH TWICE A DAY AS NEEDED FOR ANXIETY   [DISCONTINUED] buPROPion (WELLBUTRIN SR) 150 MG 12 hr tablet Take 1 tablet (150 mg total) by mouth 2 (two) times daily.   [DISCONTINUED] Cholecalciferol (VITAMIN D3 PO) Take by mouth.   [DISCONTINUED] diclofenac (VOLTAREN) 50 MG EC tablet    [DISCONTINUED] doxycycline (VIBRAMYCIN) 100 MG capsule    [DISCONTINUED] ferrous gluconate (FERGON) 240 (27 FE) MG tablet TAKE 1 TABLET BY MOUTH 2 TIMES A DAY   [DISCONTINUED] fluconazole (DIFLUCAN) 150 MG tablet TAKE 1 TABLET BY MOUTH ON DAY 1. TAKE  SECOND TAB 3 DAYS LATER IF STILL SYMPTOMATIC   [DISCONTINUED] meloxicam (MOBIC) 15 MG tablet Take 1 tablet by mouth daily.   [DISCONTINUED] Multiple Vitamin (MULTIVITAMINS PO) Take by mouth.   [DISCONTINUED] traZODone (DESYREL) 100 MG tablet TAKE 1 TABLET BY MOUTH AT BEDTIME AS NEEDED   [DISCONTINUED] traZODone (DESYREL) 50 MG tablet TAKE 1/2 TO 1 TABLET BY MOUTH AS NEEDED FOR SLEEP (IF WAKE UP IN NIGHT AND CAN'T SLEEP)   No facility-administered medications prior to visit.    Objective    BP (!) 133/96 (BP Location: Right Arm, Patient Position: Sitting, Cuff Size: Normal)   Pulse 80   Ht 5\' 3"  (1.6 m)   Wt 202 lb 12.8 oz (92 kg)   BMI 35.92 kg/m    Physical Exam Vitals and nursing note reviewed.  Constitutional:      General: She is awake. She is not in acute distress.    Appearance: Normal appearance. She is well-developed and well-groomed. She is obese. She is not ill-appearing, toxic-appearing or diaphoretic.  HENT:     Head: Normocephalic and atraumatic.     Jaw: There is normal jaw occlusion. No trismus, tenderness, swelling or pain on movement.     Right Ear: Hearing, tympanic membrane, ear canal and external ear normal. There is no impacted cerumen.     Left Ear: Hearing, tympanic membrane, ear canal and external ear normal. There is no impacted cerumen.     Nose: Nose normal. No congestion or rhinorrhea.     Right Turbinates: Not enlarged, swollen or pale.     Left Turbinates: Not enlarged, swollen or pale.     Right Sinus: No maxillary sinus tenderness or frontal sinus tenderness.     Left Sinus: No maxillary sinus tenderness or frontal sinus tenderness.     Mouth/Throat:     Lips: Pink.     Mouth: Mucous membranes are moist. No injury.     Tongue: No lesions.     Pharynx: Oropharynx is clear. Uvula midline. No pharyngeal swelling, oropharyngeal exudate, posterior oropharyngeal erythema or uvula swelling.     Tonsils: No tonsillar exudate or tonsillar abscesses.   Eyes:     General: Lids are normal. Lids are everted, no foreign bodies appreciated. Vision grossly intact. Gaze aligned appropriately. No allergic shiner or visual field deficit.       Right eye: No discharge.        Left eye: No discharge.     Extraocular Movements: Extraocular movements intact.     Conjunctiva/sclera: Conjunctivae normal.     Right eye: Right conjunctiva is not injected. No exudate.    Left eye: Left conjunctiva is not injected. No exudate.  Pupils: Pupils are equal, round, and reactive to light.  Neck:     Thyroid: No thyroid mass, thyromegaly or thyroid tenderness.     Vascular: No carotid bruit.     Trachea: Trachea normal.  Cardiovascular:     Rate and Rhythm: Normal rate and regular rhythm.     Pulses: Normal pulses.          Carotid pulses are 2+ on the right side and 2+ on the left side.      Radial pulses are 2+ on the right side and 2+ on the left side.       Dorsalis pedis pulses are 2+ on the right side and 2+ on the left side.       Posterior tibial pulses are 2+ on the right side and 2+ on the left side.     Heart sounds: Normal heart sounds, S1 normal and S2 normal. No murmur heard.    No friction rub. No gallop.  Pulmonary:     Effort: Pulmonary effort is normal. No respiratory distress.     Breath sounds: Normal breath sounds and air entry. No stridor. No wheezing, rhonchi or rales.  Chest:     Chest wall: No tenderness.  Abdominal:     General: Abdomen is flat. Bowel sounds are normal. There is no distension.     Palpations: Abdomen is soft. There is no mass.     Tenderness: There is no abdominal tenderness. There is no right CVA tenderness, left CVA tenderness, guarding or rebound.     Hernia: No hernia is present.  Genitourinary:    Comments: Exam deferred; denies complaints Musculoskeletal:        General: No swelling, tenderness, deformity or signs of injury. Normal range of motion.     Cervical back: Full passive range of motion  without pain, normal range of motion and neck supple. No edema, rigidity or tenderness. No muscular tenderness.     Right lower leg: No edema.     Left lower leg: No edema.  Lymphadenopathy:     Cervical: No cervical adenopathy.     Right cervical: No superficial, deep or posterior cervical adenopathy.    Left cervical: No superficial, deep or posterior cervical adenopathy.  Skin:    General: Skin is warm and dry.     Capillary Refill: Capillary refill takes less than 2 seconds.     Coloration: Skin is not jaundiced or pale.     Findings: No bruising, erythema, lesion or rash.  Neurological:     General: No focal deficit present.     Mental Status: She is alert and oriented to person, place, and time. Mental status is at baseline.     GCS: GCS eye subscore is 4. GCS verbal subscore is 5. GCS motor subscore is 6.     Sensory: Sensation is intact. No sensory deficit.     Motor: Motor function is intact. No weakness.     Coordination: Coordination is intact. Coordination normal.     Gait: Gait is intact. Gait normal.  Psychiatric:        Attention and Perception: Attention and perception normal.        Mood and Affect: Mood and affect normal.        Speech: Speech normal.        Behavior: Behavior normal. Behavior is cooperative.        Thought Content: Thought content normal.        Cognition and Memory:  Cognition and memory normal.        Judgment: Judgment normal.      Last depression screening scores    07/11/2023    3:41 PM 04/25/2022    9:26 AM 10/03/2021    8:11 AM  PHQ 2/9 Scores  PHQ - 2 Score 3 1 2   PHQ- 9 Score 12 11 12    Last fall risk screening    07/11/2023    3:41 PM  Fall Risk   Falls in the past year? 0  Injury with Fall? 0  Risk for fall due to : No Fall Risks  Follow up Falls evaluation completed   Last Audit-C alcohol use screening    04/25/2022    9:27 AM  Alcohol Use Disorder Test (AUDIT)  1. How often do you have a drink containing alcohol? 1  2.  How many drinks containing alcohol do you have on a typical day when you are drinking? 0  3. How often do you have six or more drinks on one occasion? 0  AUDIT-C Score 1   A score of 3 or more in women, and 4 or more in men indicates increased risk for alcohol abuse, EXCEPT if all of the points are from question 1   No results found for any visits on 07/11/23.  Assessment & Plan    Routine Health Maintenance and Physical Exam  Exercise Activities and Dietary recommendations  Goals      Find Help in My Community     Timeframe:  Long-Range Goal Priority:  Medium Start Date:      05/01/22                       Expected End Date:    05/15/22              Follow Up Date 05/15/22    - follow-up on any referrals for help I am given - have a back-up plan - make a list of family or friends that I can call  -complete Advanced Directive once received , have in notarized and uploaded to your medical record   Why is this important?   Knowing how and where to find help for yourself or family in your neighborhood and community is an important skill.  You will want to take some steps to learn how.    Notes:         Immunization History  Administered Date(s) Administered   Influenza-Unspecified 07/16/2017, 12/12/2018, 07/29/2019   Td 11/19/2005   Tdap 05/19/2013    Health Maintenance  Topic Date Due   Hepatitis C Screening  Never done   COVID-19 Vaccine (1 - 2023-24 season) Never done   DTaP/Tdap/Td (3 - Td or Tdap) 05/20/2023   INFLUENZA VACCINE  02/17/2024 (Originally 06/20/2023)   PAP SMEAR-Modifier  08/23/2026   Colonoscopy  09/21/2031   HIV Screening  Completed   HPV VACCINES  Aged Out    Discussed health benefits of physical activity, and encouraged her to engage in regular exercise appropriate for her age and condition.  Problem List Items Addressed This Visit       Cardiovascular and Mediastinum   Primary hypertension    Chronic, remains elevated Goal 139/89 or  less Recommend trial of Zestoretic to assist      Relevant Medications   lisinopril-hydrochlorothiazide (ZESTORETIC) 20-25 MG tablet     Other   Annual physical exam - Primary    Things to do to keep  yourself healthy  - Exercise at least 30-45 minutes a day, 3-4 days a week.  - Eat a low-fat diet with lots of fruits and vegetables, up to 7-9 servings per day.  - Seatbelts can save your life. Wear them always.  - Smoke detectors on every level of your home, check batteries every year.  - Eye Doctor - have an eye exam every 1-2 years  - Safe sex - if you may be exposed to STDs, use a condom.  - Alcohol -  If you drink, do it moderately, less than 2 drinks per day.  - Health Care Power of Attorney. Choose someone to speak for you if you are not able.  - Depression is common in our stressful world.If you're feeling down or losing interest in things you normally enjoy, please come in for a visit.  - Violence - If anyone is threatening or hurting you, please call immediately.       Anxiety, generalized    Chronic, worsening Restart low dose benzo to assist as needed Continue to monitor use for c/o dependency Also increase paxil to 40 mg to assist      Relevant Medications   PARoxetine (PAXIL) 40 MG tablet   Mood disorder (HCC)    Chronic, worsening Increase paxil from 20 to 40 to assist      Relevant Medications   clonazePAM (KLONOPIN) 0.5 MG tablet   PARoxetine (PAXIL) 40 MG tablet   Primary insomnia    Chronic, uncontrolled Continue 100-150 mg as needed/night      Other Visit Diagnoses     Depression, major, single episode, moderate (HCC)       Relevant Medications   PARoxetine (PAXIL) 40 MG tablet      Return in about 6 months (around 01/11/2024) for chonic disease management.    Leilani Merl, FNP, have reviewed all documentation for this visit. The documentation on 07/11/23 for the exam, diagnosis, procedures, and orders are all accurate and complete.  Jacky Kindle, FNP  [facility] [phone] (phone) [phone] (fax)  [facility]

## 2023-07-11 NOTE — Assessment & Plan Note (Signed)
Chronic, worsening Restart low dose benzo to assist as needed Continue to monitor use for c/o dependency Also increase paxil to 40 mg to assist

## 2023-11-08 ENCOUNTER — Ambulatory Visit
Admission: EM | Admit: 2023-11-08 | Discharge: 2023-11-08 | Disposition: A | Discharge status: Home / Self Care | Payer: No Typology Code available for payment source | Attending: Emergency Medicine | Admitting: Emergency Medicine

## 2023-11-08 DIAGNOSIS — N898 Other specified noninflammatory disorders of vagina: Secondary | ICD-10-CM | POA: Diagnosis present

## 2023-11-08 LAB — POCT URINALYSIS DIP (MANUAL ENTRY)
Bilirubin, UA: NEGATIVE
Blood, UA: NEGATIVE
Glucose, UA: NEGATIVE mg/dL
Leukocytes, UA: NEGATIVE
Nitrite, UA: NEGATIVE
Protein Ur, POC: NEGATIVE mg/dL
Spec Grav, UA: 1.02
Urobilinogen, UA: 0.2 U/dL
pH, UA: 6.5

## 2023-11-08 MED ORDER — CEFTRIAXONE SODIUM 500 MG IJ SOLR
500.0000 mg | INTRAMUSCULAR | Status: DC
  Administered 2023-11-08: 500 mg via INTRAMUSCULAR

## 2023-11-08 NOTE — ED Triage Notes (Signed)
Clear vaginal discharge, odor, and discomfort. Painful when urinate x 1 week. Pt states partner was positive for a bacterial infection.

## 2023-11-08 NOTE — Discharge Instructions (Signed)
Urinalysis is negative for bladder infection  You have been treated prophylactically with an injection of Rocephin for coverage for gonorrhea based on the information given by your partner  Men are not tested for or treated for bacterial vaginosis  Labs pending 2-3 days, you will be contacted if positive for any sti and treatment will be sent to the pharmacy, you will have to return to the clinic if positive for gonorrhea to receive treatment   Please refrain from having sex until labs results, if positive please refrain from having sex until treatment complete and symptoms resolve   If positive for , Chlamydia  gonorrhea or trichomoniasis please notify partner or partners so they may tested as well  Moving forward, it is recommended you use some form of protection against the transmission of sti infections  such as condoms or dental dams with each sexual encounter

## 2023-11-08 NOTE — ED Provider Notes (Signed)
Renaldo Fiddler    CSN: [id number] Arrival date & time: 11/08/23  1004      History   Chief Complaint Chief Complaint  Patient presents with   Vaginal Discharge    HPI Carrie Stokes is a 47 y.o. female.   Patient presents for evaluation of increased vaginal discharge, clear in color, vaginal odor, lower abdominal discomfort and dysuria present for 7 days.  Was notified by partner this morning that he is tested positive for a " bacterial infection", unable to tell which one.  Endorses that he has to return to clinic for treatment.  Denies hematuria, flank pain or fever.  Past Medical History:  Diagnosis Date   Anemia    Anxiety    Major depression     Patient Active Problem List   Diagnosis Date Noted   Annual physical exam 07/11/2023   Mood disorder (HCC) 07/11/2023   Primary hypertension 07/11/2023   Primary insomnia 07/11/2023   Anxiety, generalized 10/31/2015    Past Surgical History:  Procedure Laterality Date   APPENDECTOMY  2005   CHOLECYSTECTOMY  2005   Dr. Evette Cristal   COLONOSCOPY WITH PROPOFOL N/A 09/20/2021   Procedure: COLONOSCOPY WITH PROPOFOL;  Surgeon: Pasty Spillers, MD;  Location: [facility] ENDOSCOPY;  Service: Endoscopy;  Laterality: N/A;   GASTRIC BYPASS     KNEE ARTHROSCOPY W/ ACL RECONSTRUCTION Right 07/13/2015   Dr. Wyline Mood (miniscus)    OB History     Gravida  2   Para  1   Term  1   Preterm      AB  1   Living  1      SAB  1   IAB      Ectopic      Multiple      Live Births  1            Home Medications    Prior to Admission medications   Medication Sig Start Date End Date Taking? Authorizing Provider  clonazePAM (KLONOPIN) 0.5 MG tablet Take 1 tablet (0.5 mg total) by mouth daily as needed for anxiety. 07/11/23  Yes Jacky Kindle, FNP  lisinopril-hydrochlorothiazide (ZESTORETIC) 20-25 MG tablet Take 1 tablet by mouth daily. 07/11/23  Yes Jacky Kindle, FNP  PARoxetine (PAXIL) 40 MG tablet Take 1  tablet (40 mg total) by mouth daily. 07/11/23  Yes Merita Norton T, FNP  levonorgestrel (MIRENA, 52 MG,) 20 MCG/24HR IUD by Intrauterine route once for 1 dose. 04/20/19 04/20/19  Copland, Ilona Sorrel, PA-C    Family History Family History  Problem Relation Age of Onset   Asthma Mother    Colon cancer Maternal Aunt 60       stage 4, mtes to lung and liver   Lung cancer Maternal Aunt    Lung cancer Maternal Grandmother    Breast cancer Other     Social History Social History   Tobacco Use   Smoking status: Never   Smokeless tobacco: Never  Vaping Use   Vaping status: Never Used  Substance Use Topics   Alcohol use: Yes    Comment: occasional   Drug use: No     Allergies   Patient has no known allergies.   Review of Systems Review of Systems   Physical Exam Triage Vital Signs ED Triage Vitals [11/08/23 1040]  Encounter Vitals Group     BP 120/82     Systolic BP Percentile      Diastolic BP Percentile  Pulse Rate 85     Resp 16     Temp 98.8 F (37.1 C)     Temp Source Oral     SpO2 98 %     Weight      Height      Head Circumference      Peak Flow      Pain Score 8     Pain Loc      Pain Education      Exclude from Growth Chart    No data found.  Updated Vital Signs BP 120/82 (BP Location: Left Arm)   Pulse 85   Temp 98.8 F (37.1 C) (Oral)   Resp 16   LMP  (LMP Unknown)   SpO2 98%   Visual Acuity Right Eye Distance:   Left Eye Distance:   Bilateral Distance:    Right Eye Near:   Left Eye Near:    Bilateral Near:     Physical Exam Constitutional:      Appearance: Normal appearance.  Eyes:     Extraocular Movements: Extraocular movements intact.  Pulmonary:     Effort: Pulmonary effort is normal.  Genitourinary:    Comments: deferred Neurological:     Mental Status: She is alert and oriented to person, place, and time.      UC Treatments / Results  Labs (all labs ordered are listed, but only abnormal results are displayed) Labs  Reviewed  POCT URINALYSIS DIP (MANUAL ENTRY) - Abnormal; Notable for the following components:      Result Value   Ketones, POC UA small (15) (*)    All other components within normal limits  CERVICOVAGINAL ANCILLARY ONLY    EKG   Radiology No results found.  Procedures Procedures (including critical care time)  Medications Ordered in UC Medications  cefTRIAXone (ROCEPHIN) injection 500 mg (500 mg Intramuscular Given by Other 11/08/23 1100)    Initial Impression / Assessment and Plan / UC Course  I have reviewed the triage vital signs and the nursing notes.  Pertinent labs & imaging results that were available during my care of the patient were reviewed by me and considered in my medical decision making (see chart for details).  Vaginal Discharge  Urinalysis negative, STI panel pending, based on discussion with her partner we will prophylactically treat for gonorrhea, Rocephin given advised abstinence post treatment for 7 days, advised abstinence until lab results, symptoms resolve.  Advised condom use during encounters moving forward Final Clinical Impressions(s) / UC Diagnoses   Final diagnoses:  Vaginal discharge     Discharge Instructions      Urinalysis is negative for bladder infection  You have been treated prophylactically with an injection of Rocephin for coverage for gonorrhea based on the information given by your partner  Men are not tested for or treated for bacterial vaginosis  Labs pending 2-3 days, you will be contacted if positive for any sti and treatment will be sent to the pharmacy, you will have to return to the clinic if positive for gonorrhea to receive treatment   Please refrain from having sex until labs results, if positive please refrain from having sex until treatment complete and symptoms resolve   If positive for , Chlamydia  gonorrhea or trichomoniasis please notify partner or partners so they may tested as well  Moving forward, it is  recommended you use some form of protection against the transmission of sti infections  such as condoms or dental dams with each sexual encounter  ED Prescriptions   None    PDMP not reviewed this encounter.   Valinda Hoar, NP 11/08/23 1113

## 2023-11-11 ENCOUNTER — Telehealth (HOSPITAL_COMMUNITY): Payer: Self-pay

## 2023-11-11 MED ORDER — METRONIDAZOLE 500 MG PO TABS: 500.0000 mg | ORAL_TABLET | Freq: Two times a day (BID) | ORAL | 0 refills | Status: AC

## 2023-11-11 NOTE — Telephone Encounter (Signed)
Per protocol, pt requires tx with metronidazole. Rx sent to pharmacy on file.

## 2023-11-12 LAB — CERVICOVAGINAL ANCILLARY ONLY
Bacterial Vaginitis (gardnerella): POSITIVE — AB
Candida Glabrata: NEGATIVE
Candida Vaginitis: NEGATIVE
Chlamydia: NEGATIVE
Comment: NEGATIVE
Comment: NEGATIVE
Comment: NEGATIVE
Comment: NEGATIVE
Comment: NEGATIVE
Comment: NORMAL
Neisseria Gonorrhea: NEGATIVE
Trichomonas: NEGATIVE

## 2024-02-21 ENCOUNTER — Telehealth: Payer: Self-pay | Admitting: Family Medicine

## 2024-02-21 DIAGNOSIS — F39 Unspecified mood [affective] disorder: Secondary | ICD-10-CM

## 2024-02-21 MED ORDER — CLONAZEPAM 0.5 MG PO TABS: 0.5000 mg | ORAL_TABLET | Freq: Every day | ORAL | 1 refills | Status: DC | PRN

## 2024-02-21 NOTE — Telephone Encounter (Signed)
[facility] faxed refill request for the following medications:   clonazePAM (KLONOPIN) 0.5 MG tablet    Please advise.

## 2024-05-05 ENCOUNTER — Telehealth: Payer: Self-pay | Admitting: Family Medicine

## 2024-05-05 DIAGNOSIS — F39 Unspecified mood [affective] disorder: Secondary | ICD-10-CM

## 2024-05-05 LAB — LAB REPORT - SCANNED: TSH: 2.49 (ref 0.41–5.90)

## 2024-05-05 MED ORDER — CLONAZEPAM 0.5 MG PO TABS: 0.5000 mg | ORAL_TABLET | Freq: Every day | ORAL | 1 refills | Status: DC | PRN

## 2024-05-05 NOTE — Telephone Encounter (Signed)
 Copied from CRM [id number]. Topic: Clinical - Medication Refill >> May 05, 2024 11:13 AM Crispin Dolphin wrote: Medication:  clonazePAM  (KLONOPIN ) 0.5 MG tablet  Has the patient contacted their pharmacy? No (Agent: If no, request that the patient contact the pharmacy for the refill. If patient does not wish to contact the pharmacy document the reason why and proceed with request.) (Agent: If yes, when and what did the pharmacy advise?)  This is the patient's preferred pharmacy:  [facility] [address] [place] [facility] [address] [place] [address] Phone: [phone] Fax: [phone]  Is this the correct pharmacy for this prescription? Yes If no, delete pharmacy and type the correct one.   Has the prescription been filled recently? Yes  Is the patient out of the medication? No  Has the patient been seen for an appointment in the last year OR does the patient have an upcoming appointment? Yes - new provider visit scheduled September  Can we respond through MyChart? Yes  Agent: Please be advised that Rx refills may take up to 3 business days. We ask that you follow-up with your pharmacy.   ----------------------------------------------------------------------- From previous Reason for Contact - Scheduling: Patient/patient representative is calling to schedule an appointment. Refer to attachments for appointment information.

## 2024-06-12 ENCOUNTER — Other Ambulatory Visit: Payer: Self-pay | Admitting: Obstetrics and Gynecology

## 2024-06-12 DIAGNOSIS — Z1231 Encounter for screening mammogram for malignant neoplasm of breast: Secondary | ICD-10-CM

## 2024-07-01 ENCOUNTER — Other Ambulatory Visit: Payer: Self-pay

## 2024-07-01 ENCOUNTER — Encounter

## 2024-07-01 ENCOUNTER — Telehealth: Payer: Self-pay | Admitting: Family Medicine

## 2024-07-01 MED ORDER — LISINOPRIL-HYDROCHLOROTHIAZIDE 20-25 MG PO TABS: 1.0000 | ORAL_TABLET | Freq: Every day | ORAL | 0 refills | Status: DC

## 2024-07-01 NOTE — Telephone Encounter (Signed)
 [facility] faxed refill request for the following medications:   PARoxetine  (PAXIL ) 40 MG tablet    Please advise.

## 2024-07-01 NOTE — Telephone Encounter (Signed)
 [facility] for the following medications:  lisinopril -hydrochlorothiazide  (ZESTORETIC ) 20-25 MG tablet    Please advise

## 2024-07-02 ENCOUNTER — Other Ambulatory Visit: Payer: Self-pay

## 2024-07-02 DIAGNOSIS — F39 Unspecified mood [affective] disorder: Secondary | ICD-10-CM

## 2024-07-02 NOTE — Telephone Encounter (Signed)
 Patient has not been seen since [place] saw her last.  She does have appointment with you in September

## 2024-07-02 NOTE — Telephone Encounter (Signed)
Converted to refill request 

## 2024-07-03 MED ORDER — PAROXETINE HCL 40 MG PO TABS: 40.0000 mg | ORAL_TABLET | Freq: Every day | ORAL | 0 refills | Status: DC

## 2024-07-14 ENCOUNTER — Encounter: Payer: No Typology Code available for payment source | Admitting: Family Medicine

## 2024-08-03 ENCOUNTER — Encounter: Payer: Self-pay | Admitting: Family Medicine

## 2024-08-03 ENCOUNTER — Ambulatory Visit: Admitting: Family Medicine

## 2024-08-03 VITALS — BP 107/84 | HR 76 | Ht 62.0 in | Wt 188.8 lb

## 2024-08-03 DIAGNOSIS — R600 Localized edema: Secondary | ICD-10-CM

## 2024-08-03 DIAGNOSIS — F39 Unspecified mood [affective] disorder: Secondary | ICD-10-CM | POA: Diagnosis not present

## 2024-08-03 DIAGNOSIS — F411 Generalized anxiety disorder: Secondary | ICD-10-CM

## 2024-08-03 DIAGNOSIS — F5101 Primary insomnia: Secondary | ICD-10-CM | POA: Diagnosis not present

## 2024-08-03 DIAGNOSIS — I10 Essential (primary) hypertension: Secondary | ICD-10-CM

## 2024-08-03 DIAGNOSIS — E559 Vitamin D deficiency, unspecified: Secondary | ICD-10-CM

## 2024-08-03 DIAGNOSIS — N951 Menopausal and female climacteric states: Secondary | ICD-10-CM

## 2024-08-03 DIAGNOSIS — Z23 Encounter for immunization: Secondary | ICD-10-CM

## 2024-08-03 DIAGNOSIS — Z0000 Encounter for general adult medical examination without abnormal findings: Secondary | ICD-10-CM | POA: Insufficient documentation

## 2024-08-03 DIAGNOSIS — R252 Cramp and spasm: Secondary | ICD-10-CM

## 2024-08-03 MED ORDER — CLONAZEPAM 0.5 MG PO TABS: 0.5000 mg | ORAL_TABLET | Freq: Every day | ORAL | 1 refills | Status: DC | PRN

## 2024-08-03 MED ORDER — LISINOPRIL-HYDROCHLOROTHIAZIDE 20-25 MG PO TABS: 1.0000 | ORAL_TABLET | Freq: Every day | ORAL | 0 refills | Status: DC

## 2024-08-03 MED ORDER — PAROXETINE HCL 40 MG PO TABS: 40.0000 mg | ORAL_TABLET | Freq: Every day | ORAL | 0 refills | Status: DC

## 2024-08-03 MED ORDER — GABAPENTIN 100 MG PO CAPS: 100.0000 mg | ORAL_CAPSULE | Freq: Every day | ORAL | 3 refills | Status: DC

## 2024-08-03 NOTE — Assessment & Plan Note (Signed)
Tdap vaccine administered today, patient tolerated vaccine well  

## 2024-08-03 NOTE — Assessment & Plan Note (Signed)
 Chronic left lower extremity non-pitting edema and leg cramps. Possible venous insufficiency. Previous labs showed low sodium levels. Current medications include a potassium supplement and multivitamins. - Order comprehensive metabolic panel (CMP) and magnesium level to evaluate electrolyte status

## 2024-08-03 NOTE — Patient Instructions (Signed)
 To keep you healthy, please keep in mind the following health maintenance items that you are due for:   Health Maintenance Due  Topic Date Due   Hepatitis C Screening  Never done   DTaP/Tdap/Td (3 - Td or Tdap) 05/20/2023     Best Wishes,   Dr. Lang

## 2024-08-03 NOTE — Assessment & Plan Note (Signed)
 Menopausal vasomotor symptoms Chronic vasomotor symptoms associated with menopause, including hot and cold flashes and mood changes, ongoing for a year. No prior treatment. Blood pressure is well-controlled, so clonidine is not recommended. Avoidance of serotonin reuptake inhibitors due to current Paxil  use. Gabapentin  is considered a suitable option for symptom management. - Initiate gabapentin  100-300mg  mg at bedtime for vasomotor symptoms - Consider hormone therapy if symptoms do not improve with gabapentin  - Re-evaluate symptoms in 8 weeks

## 2024-08-03 NOTE — Progress Notes (Signed)
 Established patient visit   Patient: Carrie Stokes   DOB: July 06, 1976   48 y.o. Female  MRN: [id number] Visit Date: 08/03/2024  Today's healthcare provider: Rockie Agent, MD   Chief Complaint  Patient presents with   Establish Care    Patient presents to establish care with a new PCP. Patient eats a high protein and low sugar diet and is exercising 3-4 times weekly. Patient UTD on screenings, will receive Tdap today   Medication Refill    Patient is needing a refill of all medications    Menopause    Patient believes she may e pre menopause, has hot and cold flashes often, mood changes. Onset x 1 year   Subjective     HPI     Establish Care    Additional comments: Patient presents to establish care with a new PCP. Patient eats a high protein and low sugar diet and is exercising 3-4 times weekly. Patient UTD on screenings, will receive Tdap today        Medication Refill    Additional comments: Patient is needing a refill of all medications         Menopause    Additional comments: Patient believes she may e pre menopause, has hot and cold flashes often, mood changes. Onset x 1 year      Last edited by Cherry Chiquita HERO, CMA on 08/03/2024  9:34 AM.       Discussed the use of AI scribe software for clinical note transcription with the patient, who gave verbal consent to proceed.  History of Present Illness Carrie Stokes is a 48 year old female who presents to transfer her care from a previous PCP.  She experiences chronic anxiety, managed with Paxil  40 mg daily and clonazepam  0.5 mg as needed. Despite significant stress, she feels stable with this regimen. Her mood disorder, associated with anxiety and depression, is also managed with Paxil . She experiences mood changes and hot and cold flashes, which she attributes to menopause.  Her hypertension is managed with lisinopril  20 mg and hydrochlorothiazide  25 mg daily. Her most recent  blood pressure reading was 107/84 mmHg. She is concerned about her sodium levels, which were low at 129 mmol/L in June. She does not consume much salt and primarily drinks coffee and water.  She has been experiencing vasomotor symptoms related to menopause, including hot flashes and mood changes, for the past year. She has not had a menstrual period for an unspecified duration, likely due to her Mirena  IUD, which is her second one. Occasionally, she experiences light bleeding after sexual intercourse.  She experiences leg cramps in the left lower extremity. She takes a potassium pill daily to manage cramps and reports that her blood pressure medication helps with the edema.  She follows a high-protein, low-sugar diet and exercises three to four times a week. She has lost a significant amount of weight, previously weighing almost 400 pounds and now down to 188 pounds after gastric bypass surgery 22 years ago. She is currently dealing with significant life stressors, including selling her house, going through a divorce, and managing financial responsibilities. She recently returned from a vacation.     Past Medical History:  Diagnosis Date   Anemia    Anxiety    Major depression     Medications: Outpatient Medications Prior to Visit  Medication Sig   levonorgestrel  (MIRENA , 52 MG,) 20 MCG/24HR IUD by Intrauterine route once for 1 dose.   [  DISCONTINUED] clonazePAM  (KLONOPIN ) 0.5 MG tablet Take 1 tablet (0.5 mg total) by mouth daily as needed for anxiety.   [DISCONTINUED] lisinopril -hydrochlorothiazide  (ZESTORETIC ) 20-25 MG tablet Take 1 tablet by mouth daily.   [DISCONTINUED] PARoxetine  (PAXIL ) 40 MG tablet Take 1 tablet (40 mg total) by mouth daily.   Ergocalciferol (VITAMIN D2) 10 MCG (400 UNIT) TABS Take 1 tablet by mouth daily.   Iron Combinations (IRON COMPLEX) CAPS Take 1 capsule by mouth daily.   No facility-administered medications prior to visit.    Review of Systems  Last  CBC Lab Results  Component Value Date   WBC 4.6 06/21/2021   HGB 14.9 06/21/2021   HCT 44 06/21/2021   PLT 210 06/21/2021   Last metabolic panel Lab Results  Component Value Date   NA 137 06/21/2021   K 3.8 06/21/2021   CL 98 (A) 06/21/2021   BUN 11 01/01/2023   CREATININE 1.0 08/31/2015   CALCIUM 9.7 01/01/2023   ALBUMIN 4.2 01/01/2023   ALKPHOS 69 01/01/2023   AST 24 01/01/2023   ALT 26 01/01/2023   Last lipids Lab Results  Component Value Date   CHOL 178 01/01/2023   HDL 89 (A) 01/01/2023   LDLCALC 75 01/01/2023   TRIG 77 01/01/2023   Last hemoglobin A1c No results found for: HGBA1C Last thyroid functions Lab Results  Component Value Date   TSH 2.49 05/05/2024   Last vitamin D No results found for: 25OHVITD2, 25OHVITD3, VD25OH Last vitamin B12 and Folate No results found for: VITAMINB12, FOLATE   Results LABS Sodium: 129 mmol/L (04/2024) Calcium: 9.3 mg/dL (93/7974) Alkaline Phosphatase: 68 U/L (04/2024) Potassium: 4.1 mmol/L (04/2024)        Objective    BP 107/84 (BP Location: Right Arm, Patient Position: Sitting, Cuff Size: Normal)   Pulse 76   Ht 5' 2 (1.575 m)   Wt 188 lb 12.8 oz (85.6 kg)   SpO2 100%   BMI 34.53 kg/m   BP Readings from Last 3 Encounters:  08/03/24 107/84  11/08/23 120/82  07/11/23 (!) 133/96   Wt Readings from Last 3 Encounters:  08/03/24 188 lb 12.8 oz (85.6 kg)  07/11/23 202 lb 12.8 oz (92 kg)  04/25/22 211 lb 4.8 oz (95.8 kg)        Physical Exam  Physical Exam VITALS: BP- 107/84 MEASUREMENTS: Weight- 188. CHEST: Clear to auscultation bilaterally, no wheezes or crackles Extremities: LLE edema, nonpitting    No results found for any visits on 08/03/24.  Assessment & Plan     Problem List Items Addressed This Visit     Anxiety, generalized   Chronic anxiety and mood disorder with depression Chronic anxiety and mood disorder with depression, managed with Paxil  40 mg daily and clonazepam   0.5 mg as needed. - Continue Paxil  40 mg daily - Continue clonazepam  0.5 mg as needed for anxiety      Relevant Medications   PARoxetine  (PAXIL ) 40 MG tablet   Healthcare maintenance   Tdap vaccine administered today, patient tolerated vaccine well        Leg edema, left   Relevant Orders   CMP14+EGFR   Magnesium   Mood disorder (HCC)   Chronic mood disorder with depression, managed with Paxil  40 mg daily and clonazepam  0.5 mg as needed. - Continue Paxil  40 mg daily - Continue clonazepam  0.5 mg as needed for anxiety      Relevant Medications   clonazePAM  (KLONOPIN ) 0.5 MG tablet   PARoxetine  (PAXIL ) 40 MG tablet  Muscle cramps   Chronic left lower extremity non-pitting edema and leg cramps. Possible venous insufficiency. Previous labs showed low sodium levels. Current medications include a potassium supplement and multivitamins. - Order comprehensive metabolic panel (CMP) and magnesium level to evaluate electrolyte status      Relevant Orders   CMP14+EGFR   Magnesium   Primary hypertension - Primary   Essential hypertension Chronic essential hypertension, currently well-controlled with lisinopril  20 mg and hydrochlorothiazide  25 mg. Blood pressure today is 107/84 mmHg. - Continue lisinopril  20 mg daily - Continue hydrochlorothiazide  25 mg daily      Relevant Medications   lisinopril -hydrochlorothiazide  (ZESTORETIC ) 20-25 MG tablet   Other Relevant Orders   CMP14+EGFR   Primary insomnia   Chronic  Stable  No reported symptoms today      Vasomotor symptoms due to menopause   Menopausal vasomotor symptoms Chronic vasomotor symptoms associated with menopause, including hot and cold flashes and mood changes, ongoing for a year. No prior treatment. Blood pressure is well-controlled, so clonidine is not recommended. Avoidance of serotonin reuptake inhibitors due to current Paxil  use. Gabapentin  is considered a suitable option for symptom management. - Initiate  gabapentin  100-300mg  mg at bedtime for vasomotor symptoms - Consider hormone therapy if symptoms do not improve with gabapentin  - Re-evaluate symptoms in 8 weeks      Relevant Medications   gabapentin  (NEURONTIN ) 100 MG capsule   Other Visit Diagnoses       Immunization due       Relevant Orders   Tdap vaccine greater than or equal to 7yo IM (Completed)     Vitamin D deficiency           Assessment and Plan Assessment & Plan    Vitamin D deficiency Chronic vitamin D deficiency managed with vitamin D2 supplementation. - Continue vitamin D2 400 units daily     Return in about 2 months (around 10/03/2024) for VMS .         Rockie Agent, MD  [facility] [phone] (phone) [phone] (fax)  [facility]

## 2024-08-03 NOTE — Assessment & Plan Note (Signed)
 Chronic  Stable  No reported symptoms today

## 2024-08-03 NOTE — Assessment & Plan Note (Signed)
 Chronic anxiety and mood disorder with depression Chronic anxiety and mood disorder with depression, managed with Paxil  40 mg daily and clonazepam  0.5 mg as needed. - Continue Paxil  40 mg daily - Continue clonazepam  0.5 mg as needed for anxiety

## 2024-08-03 NOTE — Assessment & Plan Note (Signed)
 Essential hypertension Chronic essential hypertension, currently well-controlled with lisinopril  20 mg and hydrochlorothiazide  25 mg. Blood pressure today is 107/84 mmHg. - Continue lisinopril  20 mg daily - Continue hydrochlorothiazide  25 mg daily

## 2024-08-03 NOTE — Assessment & Plan Note (Signed)
 Chronic mood disorder with depression, managed with Paxil  40 mg daily and clonazepam  0.5 mg as needed. - Continue Paxil  40 mg daily - Continue clonazepam  0.5 mg as needed for anxiety

## 2024-08-04 LAB — CMP14+EGFR
ALT: 24 IU/L (ref 0–32)
AST: 28 IU/L (ref 0–40)
Albumin: 4.1 g/dL (ref 3.9–4.9)
Alkaline Phosphatase: 78 IU/L (ref 41–116)
BUN/Creatinine Ratio: 10 (ref 9–23)
BUN: 9 mg/dL (ref 6–24)
Bilirubin Total: 0.4 mg/dL (ref 0.0–1.2)
CO2: 22 mmol/L (ref 20–29)
Calcium: 9 mg/dL (ref 8.7–10.2)
Chloride: 90 mmol/L — ABNORMAL LOW (ref 96–106)
Creatinine, Ser: 0.88 mg/dL (ref 0.57–1.00)
Globulin, Total: 2.1 g/dL (ref 1.5–4.5)
Glucose: 79 mg/dL (ref 70–99)
Potassium: 4.8 mmol/L (ref 3.5–5.2)
Sodium: 127 mmol/L — ABNORMAL LOW (ref 134–144)
Total Protein: 6.2 g/dL (ref 6.0–8.5)
eGFR: 81 mL/min/1.73 (ref >59)

## 2024-08-04 LAB — MAGNESIUM: Magnesium: 1.8 mg/dL (ref 1.6–2.3)

## 2024-08-10 ENCOUNTER — Ambulatory Visit: Payer: Self-pay | Admitting: Family Medicine

## 2024-10-04 ENCOUNTER — Other Ambulatory Visit: Payer: Self-pay | Admitting: Medical Genetics

## 2024-10-07 ENCOUNTER — Other Ambulatory Visit
Admission: RE | Admit: 2024-10-07 | Discharge: 2024-10-07 | Disposition: A | Discharge status: Home / Self Care | Payer: Self-pay | Source: Ambulatory Visit | Attending: Medical Genetics | Admitting: Medical Genetics

## 2024-10-07 ENCOUNTER — Ambulatory Visit: Admitting: Family Medicine

## 2024-10-07 VITALS — BP 98/69 | HR 86 | Ht 62.0 in | Wt 190.7 lb

## 2024-10-07 DIAGNOSIS — E871 Hypo-osmolality and hyponatremia: Secondary | ICD-10-CM

## 2024-10-07 DIAGNOSIS — R252 Cramp and spasm: Secondary | ICD-10-CM

## 2024-10-07 DIAGNOSIS — F5101 Primary insomnia: Secondary | ICD-10-CM

## 2024-10-07 DIAGNOSIS — F39 Unspecified mood [affective] disorder: Secondary | ICD-10-CM

## 2024-10-07 DIAGNOSIS — R600 Localized edema: Secondary | ICD-10-CM | POA: Diagnosis not present

## 2024-10-07 DIAGNOSIS — N951 Menopausal and female climacteric states: Secondary | ICD-10-CM

## 2024-10-07 DIAGNOSIS — E66811 Obesity, class 1: Secondary | ICD-10-CM

## 2024-10-07 DIAGNOSIS — I10 Essential (primary) hypertension: Secondary | ICD-10-CM

## 2024-10-07 MED ORDER — PREGABALIN 50 MG PO CAPS: 50.0000 mg | ORAL_CAPSULE | Freq: Every day | ORAL | 2 refills | Status: DC

## 2024-10-07 MED ORDER — HYDROCHLOROTHIAZIDE 25 MG PO TABS: 25.0000 mg | ORAL_TABLET | Freq: Every day | ORAL | 3 refills | Status: DC

## 2024-10-07 MED ORDER — PHENTERMINE HCL 37.5 MG PO CAPS: 37.5000 mg | ORAL_CAPSULE | ORAL | 0 refills | Status: DC

## 2024-10-07 MED ORDER — CLONAZEPAM 0.5 MG PO TABS: 0.5000 mg | ORAL_TABLET | Freq: Every day | ORAL | 3 refills | Status: AC | PRN

## 2024-10-07 NOTE — Progress Notes (Signed)
 Established patient visit   Patient: Carrie Stokes   DOB: 05/29/1976   48 y.o. Female  MRN: [id number] Visit Date: 10/07/2024  Today's healthcare provider: Rockie Agent, MD   Chief Complaint  Patient presents with   vasomotor symptoms    Recheck VMS/ Na Gabapentin  is causing some side effects but is helping with symptoms    Medication Problem    Patient reports that gabapentin  is causing some strange dreams and some brain fog/ drowsiness the next day    Subjective     HPI     vasomotor symptoms    Additional comments: Recheck VMS/ Na Gabapentin  is causing some side effects but is helping with symptoms         Medication Problem    Additional comments: Patient reports that gabapentin  is causing some strange dreams and some brain fog/ drowsiness the next day       Last edited by Cherry Chiquita HERO, CMA on 10/07/2024 10:15 AM.       Discussed the use of AI scribe software for clinical note transcription with the patient, who gave verbal consent to proceed.  History of Present Illness Carrie Stokes is a 48 year old female who presents for follow-up of vasomotor symptoms.  She experiences vasomotor symptoms managed with gabapentin  100 mg at bedtime. The medication effectively reduces night sweats but causes side effects such as strange dreams and brain fogginess the following day. She adjusts the gabapentin  dose based on her work schedule and leg pain.  She has a history of primary hypertension and is on lisinopril -hydrochlorothiazide  20-25 mg. She holds the lisinopril  component due to hypotension, with a recent blood pressure reading of 98/69 mmHg. She also experiences left leg edema.  She has generalized chronic anxiety managed with Paxil  40 mg daily and clonazepam  0.5 mg as needed.  She experiences chronic muscle cramps and primary insomnia. 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 Gabapentin  helps with leg pain, especially after physically demanding shifts.  Her past medical history includes gastric bypass, appendectomy, cholecystectomy, and knee arthroplasty. She is on iron supplements and takes 10 micrograms of vitamin D daily.  She is on a weight loss journey and has gained a few pounds recently. She is more active with her grandchildren and works from home, allowing her to exercise more.     Past Medical History:  Diagnosis Date   Anemia    Anxiety    Major depression     Medications: Outpatient Medications Prior to Visit  Medication Sig   Ergocalciferol (VITAMIN D2) 10 MCG (400 UNIT) TABS Take 1 tablet by mouth daily.   Iron Combinations (IRON COMPLEX) CAPS Take 1 capsule by mouth daily.   levonorgestrel  (MIRENA , 52 MG,) 20 MCG/24HR IUD by Intrauterine route once for 1 dose.   PARoxetine  (PAXIL ) 40 MG tablet Take 1 tablet (40 mg total) by mouth daily.   [DISCONTINUED] clonazePAM  (KLONOPIN ) 0.5 MG tablet Take 1 tablet (0.5 mg total) by mouth daily as needed for anxiety.   [DISCONTINUED] gabapentin  (NEURONTIN ) 100 MG capsule Take 1-3 capsules (100-300 mg total) by mouth at bedtime.   [DISCONTINUED] lisinopril -hydrochlorothiazide  (ZESTORETIC ) 20-25 MG tablet Take 1 tablet by mouth daily.   No facility-administered medications prior to visit.    Review of Systems  Last metabolic panel Lab Results  Component Value Date   GLUCOSE 79 08/03/2024   NA 127 (L) 08/03/2024   K  4.8 08/03/2024   CL 90 (L) 08/03/2024   CO2 22 08/03/2024   BUN 9 08/03/2024   CREATININE 0.88 08/03/2024   EGFR 81 08/03/2024   CALCIUM 9.0 08/03/2024   PROT 6.2 08/03/2024   ALBUMIN 4.1 08/03/2024   LABGLOB 2.1 08/03/2024   BILITOT 0.4 08/03/2024   ALKPHOS 78 08/03/2024   AST 28 08/03/2024   ALT 24 08/03/2024   Last lipids Lab Results  Component Value Date   CHOL 178 01/01/2023   HDL 89 (A) 01/01/2023   LDLCALC 75 01/01/2023   TRIG 77 01/01/2023   Last  hemoglobin A1c No results found for: HGBA1C Last thyroid functions Lab Results  Component Value Date   TSH 2.49 05/05/2024   Last vitamin D No results found for: 25OHVITD2, 25OHVITD3, VD25OH      Objective    BP 98/69 (BP Location: Left Arm, Patient Position: Sitting, Cuff Size: Normal)   Pulse 86   Ht 5' 2 (1.575 m)   Wt 190 lb 11.2 oz (86.5 kg)   SpO2 100%   BMI 34.88 kg/m  BP Readings from Last 3 Encounters:  10/07/24 98/69  08/03/24 107/84  11/08/23 120/82   Wt Readings from Last 3 Encounters:  10/07/24 190 lb 11.2 oz (86.5 kg)  08/03/24 188 lb 12.8 oz (85.6 kg)  07/11/23 202 lb 12.8 oz (92 kg)        Physical Exam Vitals reviewed.  Constitutional:      General: She is not in acute distress.    Appearance: Normal appearance. She is not ill-appearing.  Cardiovascular:     Rate and Rhythm: Normal rate and regular rhythm.  Pulmonary:     Effort: Pulmonary effort is normal. No respiratory distress.     Breath sounds: No wheezing, rhonchi or rales.  Neurological:     Mental Status: She is alert and oriented to person, place, and time.  Psychiatric:        Mood and Affect: Mood normal.        Behavior: Behavior normal.       No results found for any visits on 10/07/24.  Assessment & Plan     Problem List Items Addressed This Visit     Leg edema, left - Primary   Edema is managed with lisinopril -hydrochlorothiazide , but hypotension is present. Considering discontinuing lisinopril  and continuing hydrochlorothiazide  alone. - Discontinued lisinopril . - Continued hydrochlorothiazide  25 mg daily.      Relevant Medications   hydrochlorothiazide  (HYDRODIURIL ) 25 MG tablet   Other Relevant Orders   Comprehensive metabolic panel with GFR   Mood disorder   Chronic Managed with Paxil  and clonazepam  as needed. - Continue Paxil  40 mg daily. - Refilled clonazepam  0.5 mg as needed.      Relevant Medications   clonazePAM  (KLONOPIN ) 0.5 MG tablet    Muscle cramps   Gabapentin  has been used for muscle cramps but causes side effects. Considering pregabalin as an alternative. - Initiated pregabalin (Lyrica) 50 mg once daily.      Relevant Medications   pregabalin (LYRICA) 50 MG capsule   Other Relevant Orders   Comprehensive metabolic panel with GFR   Primary hypertension   Chronic Hypotension is present, possibly due to lisinopril -hydrochlorothiazide . Considering discontinuing lisinopril  and continuing hydrochlorothiazide  alone. - Discontinued lisinopril . - Continued hydrochlorothiazide  25 mg daily.      Relevant Medications   hydrochlorothiazide  (HYDRODIURIL ) 25 MG tablet   Primary insomnia   Chronic Gabapentin  has been used for insomnia but causes vivid dreams. Considering  pregabalin as an alternative. - Initiated pregabalin (Lyrica) 50 mg once daily.      Vasomotor symptoms due to menopause   Chronic,improved  Gabapentin  has been effective for vasomotor symptoms but causes vivid dreams and brain fogginess. Considering switching to pregabalin (Lyrica) to manage symptoms without these side effects. - Initiated pregabalin (Lyrica) 50 mg once daily. - Instructed to monitor for side effects such as constipation, nausea, and weight changes.      Relevant Medications   pregabalin (LYRICA) 50 MG capsule   Other Visit Diagnoses       Obesity, Class I, BMI 30-34.9       Relevant Medications   phentermine  37.5 MG capsule     Hyponatremia           Assessment and Plan Assessment & Plan  Obesity, class 1 Chronic  BMI is 34.8. Considering phentermine  for weight management, with caution due to hypotension. - Initiated phentermine  37.5 mg daily. - Instructed to monitor blood pressure and weight.   Hyponatremia Sodium level was 127. Rechecking sodium levels to monitor improvement. - Ordered CMP to recheck sodium levels.     Return in about 2 months (around 12/07/2024) for Weight MGMT, Anxiety, menopause symptoms .          Rockie Agent, MD  [facility] [phone] (phone) [phone] (fax)  [facility]

## 2024-10-07 NOTE — Assessment & Plan Note (Signed)
 Edema is managed with lisinopril -hydrochlorothiazide , but hypotension is present. Considering discontinuing lisinopril  and continuing hydrochlorothiazide  alone. - Discontinued lisinopril . - Continued hydrochlorothiazide  25 mg daily.

## 2024-10-07 NOTE — Assessment & Plan Note (Signed)
 Gabapentin  has been used for muscle cramps but causes side effects. Considering pregabalin  as an alternative. - Initiated pregabalin  (Lyrica ) 50 mg once daily.

## 2024-10-07 NOTE — Assessment & Plan Note (Signed)
 Chronic Managed with Paxil  and clonazepam  as needed. - Continue Paxil  40 mg daily. - Refilled clonazepam  0.5 mg as needed.

## 2024-10-07 NOTE — Patient Instructions (Signed)
 To keep you healthy, please keep in mind the following health maintenance items that you are due for:   Health Maintenance Due  Topic Date Due   Hepatitis C Screening  Never done     Best Wishes,   Dr. Lang

## 2024-10-07 NOTE — Assessment & Plan Note (Signed)
 Chronic,improved  Gabapentin  has been effective for vasomotor symptoms but causes vivid dreams and brain fogginess. Considering switching to pregabalin  (Lyrica ) to manage symptoms without these side effects. - Initiated pregabalin  (Lyrica ) 50 mg once daily. - Instructed to monitor for side effects such as constipation, nausea, and weight changes.

## 2024-10-07 NOTE — Assessment & Plan Note (Signed)
 Chronic Hypotension is present, possibly due to lisinopril -hydrochlorothiazide . Considering discontinuing lisinopril  and continuing hydrochlorothiazide  alone. - Discontinued lisinopril . - Continued hydrochlorothiazide  25 mg daily.

## 2024-10-07 NOTE — Assessment & Plan Note (Signed)
 Chronic Gabapentin  has been used for insomnia but causes vivid dreams. Considering pregabalin as an alternative. - Initiated pregabalin (Lyrica) 50 mg once daily.

## 2024-10-08 LAB — COMPREHENSIVE METABOLIC PANEL WITH GFR
ALT: 25 IU/L (ref 0–32)
AST: 28 IU/L (ref 0–40)
Albumin: 4.3 g/dL (ref 3.9–4.9)
Alkaline Phosphatase: 93 IU/L (ref 41–116)
BUN/Creatinine Ratio: 11 (ref 9–23)
BUN: 9 mg/dL (ref 6–24)
Bilirubin Total: 0.3 mg/dL (ref 0.0–1.2)
CO2: 26 mmol/L (ref 20–29)
Calcium: 9.3 mg/dL (ref 8.7–10.2)
Chloride: 92 mmol/L — ABNORMAL LOW (ref 96–106)
Creatinine, Ser: 0.82 mg/dL (ref 0.57–1.00)
Globulin, Total: 2.4 g/dL (ref 1.5–4.5)
Glucose: 78 mg/dL (ref 70–99)
Potassium: 4.4 mmol/L (ref 3.5–5.2)
Sodium: 129 mmol/L — ABNORMAL LOW (ref 134–144)
Total Protein: 6.7 g/dL (ref 6.0–8.5)
eGFR: 88 mL/min/1.73 (ref >59)

## 2024-10-12 ENCOUNTER — Ambulatory Visit: Payer: Self-pay | Admitting: Family Medicine

## 2024-10-12 DIAGNOSIS — E871 Hypo-osmolality and hyponatremia: Secondary | ICD-10-CM

## 2024-10-14 NOTE — Progress Notes (Signed)
 Last read by Eleanor Laymon Boos at 8:02AM on 10/14/2024.

## 2024-10-17 LAB — GENECONNECT MOLECULAR SCREEN: Genetic Analysis Overall Interpretation: NEGATIVE

## 2024-11-23 ENCOUNTER — Other Ambulatory Visit: Payer: Self-pay | Admitting: Family Medicine

## 2024-11-23 DIAGNOSIS — E66811 Obesity, class 1: Secondary | ICD-10-CM

## 2024-12-15 ENCOUNTER — Ambulatory Visit: Admitting: Family Medicine

## 2024-12-16 ENCOUNTER — Other Ambulatory Visit: Payer: Self-pay | Admitting: Family Medicine

## 2024-12-16 ENCOUNTER — Ambulatory Visit: Admitting: Family Medicine

## 2024-12-16 ENCOUNTER — Encounter: Payer: Self-pay | Admitting: Family Medicine

## 2024-12-16 VITALS — BP 133/89 | HR 93 | Ht 62.0 in | Wt 200.0 lb

## 2024-12-16 DIAGNOSIS — M5432 Sciatica, left side: Secondary | ICD-10-CM

## 2024-12-16 DIAGNOSIS — E871 Hypo-osmolality and hyponatremia: Secondary | ICD-10-CM | POA: Diagnosis not present

## 2024-12-16 DIAGNOSIS — F5101 Primary insomnia: Secondary | ICD-10-CM | POA: Diagnosis not present

## 2024-12-16 DIAGNOSIS — F411 Generalized anxiety disorder: Secondary | ICD-10-CM | POA: Diagnosis not present

## 2024-12-16 DIAGNOSIS — N951 Menopausal and female climacteric states: Secondary | ICD-10-CM

## 2024-12-16 DIAGNOSIS — E66811 Obesity, class 1: Secondary | ICD-10-CM | POA: Diagnosis not present

## 2024-12-16 DIAGNOSIS — F39 Unspecified mood [affective] disorder: Secondary | ICD-10-CM

## 2024-12-16 DIAGNOSIS — I10 Essential (primary) hypertension: Secondary | ICD-10-CM | POA: Diagnosis not present

## 2024-12-16 DIAGNOSIS — R600 Localized edema: Secondary | ICD-10-CM

## 2024-12-16 MED ORDER — PHENTERMINE HCL 37.5 MG PO CAPS: 37.5000 mg | ORAL_CAPSULE | Freq: Every morning | ORAL | 0 refills | Status: AC

## 2024-12-16 MED ORDER — VENLAFAXINE HCL ER 75 MG PO CP24: 75.0000 mg | ORAL_CAPSULE | Freq: Every day | ORAL | 1 refills | Status: AC

## 2024-12-16 MED ORDER — HYDROCHLOROTHIAZIDE 25 MG PO TABS: 25.0000 mg | ORAL_TABLET | Freq: Every day | ORAL | 2 refills | Status: AC

## 2024-12-16 MED ORDER — VENLAFAXINE HCL ER 37.5 MG PO CP24: ORAL_CAPSULE | ORAL | 0 refills | Status: AC

## 2024-12-16 MED ORDER — PREDNISONE 20 MG PO TABS: ORAL_TABLET | ORAL | 0 refills | Status: AC

## 2024-12-16 MED ORDER — PAROXETINE HCL 20 MG PO TABS: ORAL_TABLET | ORAL | 0 refills | Status: AC

## 2024-12-16 NOTE — Assessment & Plan Note (Signed)
 Generalized anxiety disorder Chronic  Anxiety is elevated due to personal stressors. Currently on Paxil  40 mg daily and clonazepam  0.5 mg as needed. Plan to transition from Paxil  to Effexor  to manage anxiety and vasomotor symptoms. - Reduced Paxil  to 20 mg daily for 2 weeks, then will taper off - Initiated Effexor  37.5 mg daily for 14 days, then will increase to 75 mg daily for 2 weeks

## 2024-12-16 NOTE — Progress Notes (Signed)
 "  Established Patient Office Visit  Patient ID: Carrie Stokes, female    DOB: [birth year]  Age: 49 y.o. MRN: [id number] PCP: Sharma Coyer, MD  Chief Complaint  Patient presents with   Medical Management of Chronic Issues    Patient is present for f/u with PCP weight mgmt, anxiety, VMS   Would like to discuss lyrica  and hydrochlorothiazide  ( takes PRN for swelling maybe 2-3 x week)    Subjective:     HPI  Discussed the use of AI scribe software for clinical note transcription with the patient, who gave verbal consent to proceed.  History of Present Illness Carrie Stokes is a 49 year old female with class one obesity, vasomotor symptoms, and anxiety who presents for weight management and symptom follow-up.  She is taking phentermine  37.5 mg daily for class one obesity but has not experienced weight loss this month due to stress and being snowed in, leading to increased eating. She experiences dry mouth as a side effect of phentermine  and is attempting to manage her weight despite high stress levels.  She reports vasomotor symptoms, including night sweats, which have decreased in frequency. She was taking Lyrica  50 mg daily for these symptoms and muscle pain but discontinued it due to ineffectiveness for pain. She has tried B12 injections and increased her B complex intake, which she feels has helped with night sweats.  Her anxiety has been heightened due to personal stressors, including a recent house sale and impending divorce. She is taking Paxil  40 mg daily and clonazepam  0.5 mg as needed for anxiety. She has experienced several anxiety attacks this month.  She experiences swelling in her lower extremities, for which she takes hydrochlorothiazide  two to three times a week as needed. She also uses cortisone 25 mg as needed for swelling.  She has severe sciatica pain, primarily in her left leg, radiating from her tailbone down the leg. She manages the pain  with ibuprofen and has previously used muscle relaxers. She has not been taking Lyrica  as it was ineffective for her pain.  She is working two jobs and caring for her grandchild, which adds to her stress. She has a history of vitamin D deficiency and takes vitamin D2 10 mcg daily.     ROS    Objective:     BP 133/89 (BP Location: Left Arm, Patient Position: Sitting, Cuff Size: Normal)   Pulse 93   Ht 5' 2 (1.575 m)   Wt 200 lb (90.7 kg)   SpO2 100%   BMI 36.58 kg/m  BP Readings from Last 3 Encounters:  12/16/24 133/89  10/07/24 98/69  08/03/24 107/84   Wt Readings from Last 3 Encounters:  12/16/24 200 lb (90.7 kg)  10/07/24 190 lb 11.2 oz (86.5 kg)  08/03/24 188 lb 12.8 oz (85.6 kg)      Physical Exam Vitals reviewed.  Constitutional:      General: She is not in acute distress.    Appearance: Normal appearance. She is not ill-appearing.  Cardiovascular:     Rate and Rhythm: Normal rate and regular rhythm.  Pulmonary:     Effort: Pulmonary effort is normal. No respiratory distress.     Breath sounds: No wheezing, rhonchi or rales.  Musculoskeletal:     Right lower leg: No edema.     Left lower leg: Edema present.     Comments: No pitting edema  Neurological:     Mental Status: She is alert and oriented to person, place,  and time.  Psychiatric:        Mood and Affect: Mood normal.        Behavior: Behavior normal.      Results for orders placed or performed in visit on 12/16/24  [facility] HEPATITIS C SCREENING LAB  Result Value Ref Range   HM Hepatitis Screen Negative-Validated     Last CBC Lab Results  Component Value Date   WBC 4.6 06/21/2021   HGB 14.9 06/21/2021   HCT 44 06/21/2021   PLT 210 06/21/2021   Last metabolic panel Lab Results  Component Value Date   GLUCOSE 78 10/07/2024   NA 129 (L) 10/07/2024   K 4.4 10/07/2024   CL 92 (L) 10/07/2024   CO2 26 10/07/2024   BUN 9 10/07/2024   CREATININE 0.82 10/07/2024   EGFR 88 10/07/2024    CALCIUM 9.3 10/07/2024   PROT 6.7 10/07/2024   ALBUMIN 4.3 10/07/2024   LABGLOB 2.4 10/07/2024   BILITOT 0.3 10/07/2024   ALKPHOS 93 10/07/2024   AST 28 10/07/2024   ALT 25 10/07/2024      The 10-year ASCVD risk score (Arnett DK, et al., 2019) is: 0.7%* (Cholesterol units were assumed)  Outpatient Encounter Medications as of 12/16/2024  Medication Sig   clonazePAM  (KLONOPIN ) 0.5 MG tablet Take 1 tablet (0.5 mg total) by mouth daily as needed for anxiety.   Ergocalciferol (VITAMIN D2) 10 MCG (400 UNIT) TABS Take 1 tablet by mouth daily.   Iron Combinations (IRON COMPLEX) CAPS Take 1 capsule by mouth daily.   levonorgestrel  (MIRENA , 52 MG,) 20 MCG/24HR IUD by Intrauterine route once for 1 dose.   predniSONE  (DELTASONE ) 20 MG tablet Take 2 tablets (40 mg total) by mouth daily with breakfast for 4 days, THEN 1 tablet (20 mg total) daily with breakfast for 4 days.   venlafaxine  XR (EFFEXOR  XR) 37.5 MG 24 hr capsule Take 1 capsule (37.5 mg total) by mouth daily with breakfast for 14 days, THEN 2 capsules (75 mg total) daily with breakfast for 14 days.   [START ON 12/30/2024] venlafaxine  XR (EFFEXOR  XR) 75 MG 24 hr capsule Take 1 capsule (75 mg total) by mouth daily with breakfast.   [DISCONTINUED] hydrochlorothiazide  (HYDRODIURIL ) 25 MG tablet Take 1 tablet (25 mg total) by mouth daily. (Patient taking differently: Take 25 mg by mouth daily as needed (for swelling).)   [DISCONTINUED] phentermine  37.5 MG capsule TAKE 1 CAPSULE BY MOUTH EVERY MORNING   hydrochlorothiazide  (HYDRODIURIL ) 25 MG tablet Take 1 tablet (25 mg total) by mouth daily.   PARoxetine  (PAXIL ) 20 MG tablet Take 1 tablet (20 mg total) by mouth daily for 14 days, THEN 0.5 tablets (10 mg total) daily for 14 days, THEN 0.5 tablets (10 mg total) every other day for 14 days.   phentermine  37.5 MG capsule Take 1 capsule (37.5 mg total) by mouth every morning.   [DISCONTINUED] PARoxetine  (PAXIL ) 40 MG tablet Take 1 tablet (40 mg total) by  mouth daily.   [DISCONTINUED] pregabalin  (LYRICA ) 50 MG capsule Take 1 capsule (50 mg total) by mouth daily. (Patient not taking: Reported on 12/16/2024)   No facility-administered encounter medications on file as of 12/16/2024.       Assessment & Plan:   Problem List Items Addressed This Visit     Anxiety, generalized   Generalized anxiety disorder Chronic  Anxiety is elevated due to personal stressors. Currently on Paxil  40 mg daily and clonazepam  0.5 mg as needed. Plan to transition from Paxil  to Effexor   to manage anxiety and vasomotor symptoms. - Reduced Paxil  to 20 mg daily for 2 weeks, then will taper off - Initiated Effexor  37.5 mg daily for 14 days, then will increase to 75 mg daily for 2 weeks      Relevant Medications   PARoxetine  (PAXIL ) 20 MG tablet   venlafaxine  XR (EFFEXOR  XR) 37.5 MG 24 hr capsule   venlafaxine  XR (EFFEXOR  XR) 75 MG 24 hr capsule (Start on 12/30/2024)   Leg edema, left   Resume hydrochlorothiazide  25mg  PRN daily       Relevant Medications   hydrochlorothiazide  (HYDRODIURIL ) 25 MG tablet   Other Relevant Orders   BMP8+EGFR   RESOLVED: Mood disorder   Relevant Medications   PARoxetine  (PAXIL ) 20 MG tablet   Obesity, Class I, BMI 30-34.9   Obesity, class 1 Chronic  Continued management with phentermine  37.5 mg daily. No weight loss this month due to stress and stress eating. - Continue phentermine  37.5 mg daily      Relevant Medications   phentermine  37.5 MG capsule   Primary hypertension   Chronic  Blood pressure may be elevated due to stress and anxiety. Current management includes hydrochlorothiazide  as needed for swelling. - Refilled hydrochlorothiazide  for swelling management      Relevant Medications   hydrochlorothiazide  (HYDRODIURIL ) 25 MG tablet   Other Relevant Orders   BMP8+EGFR   Primary insomnia   Chronic  Stopped lyrica , reported not helpful for sleep       Vasomotor symptoms due to menopause - Primary   Vasomotor  symptoms due to menopause Chronic  Lyrica  was ineffective for vasomotor symptoms. Night sweats have decreased. Considering Effexor  for vasomotor symptoms and anxiety management. - Discontinued Lyrica  - Initiated Effexor  37.5 mg daily for 14 days, then will increase to 75 mg daily for 2 weeks      Relevant Medications   venlafaxine  XR (EFFEXOR  XR) 37.5 MG 24 hr capsule   venlafaxine  XR (EFFEXOR  XR) 75 MG 24 hr capsule (Start on 12/30/2024)   Other Visit Diagnoses       Hyponatremia         Left sided sciatica       Relevant Medications   PARoxetine  (PAXIL ) 20 MG tablet   phentermine  37.5 MG capsule   predniSONE  (DELTASONE ) 20 MG tablet   venlafaxine  XR (EFFEXOR  XR) 37.5 MG 24 hr capsule   venlafaxine  XR (EFFEXOR  XR) 75 MG 24 hr capsule (Start on 12/30/2024)       Assessment and Plan Assessment & Plan      Left sided sciatica Acute on chronic  Severe sciatica pain primarily in the left leg, extending from the tailbone down the leg. Pain is shooting and affects sleep. Previous use of ibuprofen for pain management. - Prescribed prednisone  40 mg daily for 4 days, then 20 mg daily for 4 days  Primary hypertension   Hyponatremia Chronic  Monitoring sodium levels due to potential imbalance from medication use and dietary intake. - Ordered BMP to check sodium levels - Provided lab order for sodium check at [facility]    Return in about 2 months (around 02/13/2025).    Rockie Agent, MD [facility]  "

## 2024-12-16 NOTE — Assessment & Plan Note (Signed)
 Obesity, class 1 Chronic  Continued management with phentermine  37.5 mg daily. No weight loss this month due to stress and stress eating. - Continue phentermine  37.5 mg daily

## 2024-12-16 NOTE — Assessment & Plan Note (Signed)
 Resume hydrochlorothiazide  25mg  PRN daily

## 2024-12-16 NOTE — Patient Instructions (Signed)
 To keep you healthy, please keep in mind the following health maintenance items that you are due for:   Health Maintenance Due  Topic Date Due   COVID-19 Vaccine (3 - 2025-26 season) 07/20/2024     Best Wishes,   Dr. Lang

## 2024-12-16 NOTE — Telephone Encounter (Signed)
 New RX request:  Insurance will not cover 1 Cap daily dosing of venlafaxine  XR (EFFEXOR  XR) 37.5 MG 24 hr capsule. Per the Pharmacy, they have filled the 14 day supply of 37.5. The pharmacy is requesting a new RX for 75mg  once daily for continuing dose.

## 2024-12-16 NOTE — Assessment & Plan Note (Signed)
 Chronic  Blood pressure may be elevated due to stress and anxiety. Current management includes hydrochlorothiazide  as needed for swelling. - Refilled hydrochlorothiazide  for swelling management

## 2024-12-16 NOTE — Assessment & Plan Note (Signed)
 Vasomotor symptoms due to menopause Chronic  Lyrica  was ineffective for vasomotor symptoms. Night sweats have decreased. Considering Effexor  for vasomotor symptoms and anxiety management. - Discontinued Lyrica  - Initiated Effexor  37.5 mg daily for 14 days, then will increase to 75 mg daily for 2 weeks

## 2024-12-16 NOTE — Assessment & Plan Note (Signed)
 Chronic  Stopped lyrica , reported not helpful for sleep

## 2025-03-17 ENCOUNTER — Ambulatory Visit: Admitting: Family Medicine
